# Patient Record
Sex: Female | Born: 2005
Health system: Southern US, Community
[De-identification: ages and names within clinical notes are randomized; demographics above are authoritative.]

## PROBLEM LIST (undated history)

## (undated) DIAGNOSIS — K921 Melena: Secondary | ICD-10-CM

## (undated) DIAGNOSIS — J189 Pneumonia, unspecified organism: Secondary | ICD-10-CM

## (undated) DIAGNOSIS — T7840XA Allergy, unspecified, initial encounter: Secondary | ICD-10-CM

## (undated) DIAGNOSIS — K200 Eosinophilic esophagitis: Secondary | ICD-10-CM

## (undated) DIAGNOSIS — J45909 Unspecified asthma, uncomplicated: Secondary | ICD-10-CM

## (undated) HISTORY — DX: Melena: K92.1

## (undated) HISTORY — DX: Eosinophilic esophagitis: K20.0

## (undated) HISTORY — PX: FRACTURE SURGERY: SHX138

---
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
## 2023-01-03 ENCOUNTER — Telehealth: Payer: Self-pay

## 2023-01-03 NOTE — Telephone Encounter (Signed)
All her blood work looks great. Please let her know.

## 2023-01-03 NOTE — Telephone Encounter (Signed)
Pt's labs are in her chart

## 2023-01-04 NOTE — Telephone Encounter (Signed)
Phoned and advised the pt of her result note. Pt expressed understanding

## 2023-03-03 ENCOUNTER — Encounter: Payer: Self-pay | Admitting: Internal Medicine

## 2023-04-01 DIAGNOSIS — J060 Acute laryngopharyngitis: Secondary | ICD-10-CM | POA: Diagnosis not present

## 2023-04-01 DIAGNOSIS — J039 Acute tonsillitis, unspecified: Secondary | ICD-10-CM | POA: Diagnosis not present

## 2023-04-01 DIAGNOSIS — U071 COVID-19: Secondary | ICD-10-CM | POA: Diagnosis not present

## 2023-04-01 DIAGNOSIS — R059 Cough, unspecified: Secondary | ICD-10-CM | POA: Diagnosis not present

## 2023-04-07 ENCOUNTER — Encounter: Payer: Self-pay | Admitting: Internal Medicine

## 2023-04-07 ENCOUNTER — Ambulatory Visit (INDEPENDENT_AMBULATORY_CARE_PROVIDER_SITE_OTHER): Payer: BC Managed Care – PPO | Admitting: Internal Medicine

## 2023-04-07 VITALS — BP 96/62 | HR 78 | Temp 98.4°F | Ht 66.0 in | Wt 155.0 lb

## 2023-04-07 DIAGNOSIS — K529 Noninfective gastroenteritis and colitis, unspecified: Secondary | ICD-10-CM

## 2023-04-07 DIAGNOSIS — K200 Eosinophilic esophagitis: Secondary | ICD-10-CM

## 2023-04-07 DIAGNOSIS — R7989 Other specified abnormal findings of blood chemistry: Secondary | ICD-10-CM

## 2023-04-07 DIAGNOSIS — K5282 Eosinophilic colitis: Secondary | ICD-10-CM

## 2023-04-07 NOTE — Progress Notes (Signed)
Primary Care Physician:  Donita Brooks, MD Primary Gastroenterologist:  Dr. Marletta Lor  Chief Complaint  Patient presents with   Follow-up    Follow up on eosinophillic esophagus    HPI:   Catherine Pittman is a 17 y.o. female who presents to the clinic today for follow up visit.  She has a history of eosinophilic esophagitis, eosinophilic colitis plus or minus IBD (+ pANCA and persistently elevated fecal calprotectin).  Significant workup previously, see below.  Liver biopsy 03/25/2022.  Liver biopsy normal without significant diagnostic alteration.  EGD 03/25/2022 largely unremarkable, mildly abnormal mucosa of the esophagus.  All biopsies WNL.  Flexible sigmoidoscopy 03/25/2022 with mild inflammation in the mid descending colon and distal rectum.  Biopsies in the rectum showed mild focal active colitis, all other colon biopsies WNL.  For her EOE, currently maintained on lansoprazole 30 mg daily.  Avoiding wheat.  Currently on Lialda 2.4 g daily for her IBD.  Today, accompanied by her mother.  Patient states overall she is doing well.  No issues with swallowing.  Feels like her EOE is well-controlled.  Currently taking lansoprazole 30 mg daily and budesonide swallowed.  In regards to her ?IBD, fecal calprotectin 10/18/2022 elevated at 167.  Restarted her mesalamine on previous visit which she states she is tolerating.  Having normal stools.  Occasionally constipated.  No blood or mucus in her stools.  No abdominal pain.    Blood work 12/30/2022 CBC, CMP, ESR, CRP, vitamin D, B12 all WNL.  Previous Workup:  GI symptoms started at 17yo with abdominal pain and hematchezia. Her labs showed an elevated calprotectin. 2014 EGD/colonoscopy diagnosed colitis/IBD at [facility] and then was referred to [facility]. She was placed on a few months of sulfasalazine. At [facility] a repeat EGD/colonoscopy was done and parents were told that she did not have IBD but did have EoE. She was placed on Flovent 2 puffs  BID and further biopsies were negative for EoE. Of note the most recent colonoscopy from 09/2019 showed chronic active colitis in the cecum which is c/w IBD. Parent state that they were unaware of this. Her previous GI doctor had placed her on high dose steroids with a wean. She was close to weaning off this summer, but then they reportedly were told by PCP to continue it at a lower dose. She had been on 10mg  daily for 4 months prior to her previous appointment. She is not on a prophylactic PPI.   01/19/22 C diff negative, calprotectin 602 (Cdiff/GIP/O&P neg) 05/2021 Stool O&P negative ([facility]) 01/26/21 MRCP normal 01/05/21 ALT 45, GGT 89 (rest of autoimmune hepatitis work up normal), hep B not immune - A1At MM, ceruloplasmin 27.8, ASMA (-), anti-LKM (-), ANA (-), INR 1.0 10/07/20 ALT 60, GGT 98 and (+)pANCA, ESR 23 09/05/20 labs were done at [facility] per mother (not scanned into our system). 07/04/20 calprotectin 360, esr 34, ferritin 4, ggt 97, vit D 20 03/18/20 ttg iga <1 with IGA 109, urea breath test neg 03/17/20 WBC 16, plt 628, hg normal 11.4, HFP normal, ESR 25 03/03/20 capsule endoscopy - Normal small bowel. 01/25/20 MRI Impression: No active small bowel or colonic inflammation. No evidence of stricturing or fistulization.  01/09/20 c diff neg, plt 652, crp 9, ESR 58  Calprotectin: 01/19/22 602, 01/05/21 421 (on Lialda), 10/07/20 268, 07/04/20 360, 12/27/19 399, 08/13/19 352, 03/15/19 911 (after coming off sulfasalazine - Dr. Rebeca Alert had recommended to repeat EGD and Colonoscopy, but family did not want to proceed  at that time), 11/12/15 135  Component  05/21/21 EGD/colonoscopy: on Lialda and PPI (?BID) showed upper and lower gastrointestinal eosinophilia    A. DUODENUM, BIOPSY: - Superficial fragments of duodenum with preserved villous architecture.   B. STOMACH, BIOPSY: - Gastric mucosa with no histologic abnormalities. - No eosinophils or H. pylori organisms identified on routine H&E sections.   C.  ESOPHAGUS, DISTAL, BIOPSY: - Benign squamous mucosa with increased intraepithelial eosinophils (focally up to 80/HPF).   D. ESOPHAGUS, MID, BIOPSY: - Benign squamous mucosa with no evidence of intraepithelial eosinophils.   E. ESOPHAGUS, PROXIMAL, BIOPSY: - Benign squamous mucosa with rare intraepithelial eosinophils (focally up to 1/HPF).   F. SMALL INTESTINE, TERMINAL ILEUM, BIOPSY: - Small bowel mucosa with no diagnostic abnormalities. - No evidence of increased eosinophils, intraepithelial lymphocytes, villous atrophy, or crypt hyperplasia.    G. COLON, ASCENDING, BIOPSY: - Colonic mucosa with increased intramucosal eosinophils (focally >100 eosinophils per HPF). - No evidence of chronic colitis.    H. COLON, TRANSVERSE & ASCENDING, BIOPSY: - Colonic mucosa with increased intraepithelial eosinophils (focally up to 50/HPF). - No evidence of chronic colitis.    I. COLON, RECTOSIGMOID, BIOPSY: - Colonic mucosa with increased intraepithelial eosinophils (focally up to 30/HPF). - No evidence of chronic colitis.  - See comment.    Electronically signed by Champ Mungo, MD on 05/26/2021 at 1435  Comment    The patient's history of asthma, eosinophilic esophagitis, and possible inflammatory bowel disease, currently on lansoprazole and Mesalazine is noted.    Histologic sections demonstrate increase in intraepithelial eosinophils (esophagus), and increased eosinophils within the lamina propria (colon). There are no histologic features supporting the diagnosis of inflammatory bowel disease on these specimens; including active inflammation (such as cryptitis, crypt abscess, or ulcers), or features of chronic injury (including architectural distortion of the mucosa, basal plasmacytosis, or metaplasia).    Overall, the above findings, in conjunction with the clinical history of asthma, raises the possibility of an eosinophilic enteritis or medication effect. However, given that  the increase of intraepithelial eosinophils in the esophagus is limited to the distal aspect, the possibility of an underlying/superimposed gastroesophageal reflux disease cannot be excluded. Correlation with clinical and endoscopic findings remains essential.   09/19/19 EGD/colon at [facility]: on flovent  A. Duodenum, second part and bulb, endoscopic biopsy: DUODENAL MUCOSA WITH NO PATHOLOGIC DIAGNOSIS.NO VILLOUS BLUNTING OR INCREASED INTRAEPITHELIAL LYMPHOCYTES ARE IDENTIFIED. B. Stomach, endoscopic biopsy: GASTRIC ANTRAL AND OXYNTIC MUCOSA WITH NO PATHOLOGIC DIAGNOSIS. NO ACTIVE OR CHRONIC GASTRITIS IS SEEN. NEGATIVE FOR EVIDENCE OF HELICOBACTER PYLORI ON ROUTINE H&E. C. Esophagus, lower third, endoscopic biopsy:SQUAMOUS MUCOSA WITH NO PATHOLOGIC DIAGNOSIS.NO EVIDENCE OF REFLUX OR EOSINOPHILIC ESOPHAGITIS IS SEEN.  D. Esophagus, middle third, endoscopic biopsy: SQUAMOUS MUCOSA WITH NO PATHOLOGIC DIAGNOSIS. NO EVIDENCE OF REFLUX OR EOSINOPHILIC ESOPHAGITIS IS SEEN. E. Esophagus, upper third, endoscopic biopsy: SQUAMOUS MUCOSA WITH NO PATHOLOGIC DIAGNOSIS. NO EVIDENCE OF REFLUX OR EOSINOPHILIC ESOPHAGITIS IS SEEN. F. Terminal ileum, endoscopic biopsy: ILEAL MUCOSA WITH NO PATHOLOGIC DIAGNOSIS. NO ACUTE OR CHRONIC ILEITIS IS IDENTIFIED. G. Cecum, endoscopic biopsy: COLONIC MUCOSA WITH PATCHY MILD CHRONIC ACTIVE COLITIS. NEGATIVE FOR DYSPLASIA. H. Right/ascending, endoscopic biopsy: COLONIC MUCOSA WITH NO PATHOLOGIC DIAGNOSIS. NEGATIVE FOR CHRONIC AND ACTIVE COLITIS. NEGATIVE FOR DYSPLASIA. I. Left/descending, endoscopic biopsy: COLONIC MUCOSA WITH NO PATHOLOGIC DIAGNOSIS. NEGATIVE FOR CHRONIC AND ACTIVE COLITIS. NEGATIVE FOR DYSPLASIA. J. Sigmoid colon, endoscopic biopsy: COLONIC MUCOSA WITH NO PATHOLOGIC DIAGNOSIS. NEGATIVE FOR CHRONIC AND ACTIVE COLITIS. NEGATIVE FOR DYSPLASIA. K. Rectum, endoscopic biopsy: COLORECTAL MUCOSA  WITH NO PATHOLOGIC DIAGNOSIS. NEGATIVE FOR CHRONIC AND ACTIVE COLITIS. NEGATIVE  FOR DYSPLASIA.   04/29/18 EGD/colon at [facility]: A. Duodenum, endoscopic biopsy: Duodenal mucosa with no significant pathologic diagnosis. No villous blunting or intraepithelial lymphocytosis is seen. B. Stomach, endoscopic biopsy: Gastric antral mucosa with no significant pathologic diagnosis. Negative for evidence of Helicobacter pylori on routine H&E.  C. Esophagus, lower third, endoscopic biopsy: Squamous mucosa with marked intraepithelial eosinophil infiltrates (up to 75 per high power field). See comment. D. Esophagus, upper third, endoscopic biopsy: Squamous mucosa with marked intraepithelial eosinophil infiltrates (up to 75 per high power field). See comment. Comment: In both distal and proximal esophagus (C, D), the eosinophils involve both superficial and deeper portions of the squamous epithelium. Intercellular edema, eosinophilic microabscesses and eosinophilic degranulation are present. The morphological findings are highly suggestive of eosinophilic esophagitis. E. Terminal ileum, endoscopic biopsy: Ileal mucosa with no significant pathologic diagnosis. No active or chronic enteritis is seen.  F. Right ascending colon, endoscopic biopsy: Colonic mucosa with no significant pathologic diagnosis. No active or chronic colitis is seen. No granulomas are seen. Negative for dysplasia. G. Left descending colon, endoscopic biopsy: Colonic mucosa with no significant pathologic diagnosis. No active or chronic colitis is seen. No granulomas are seen. Negative for dysplasia. H. Sigmoid colon and rectum, endoscopic biopsy: Colonic mucosa with no significant pathologic diagnosis. No active or chronic colitis is seen. No granulomas are seen. Negative for dysplasia.  04/20/13 colonoscopy at [facility] by Dr. Chestine Spore Colonoscopy completed 70-80 cm to cecum. Diffuse edema with adherent mucus and sporadic erosions but no ulcerations,granularlity or significant friability. Right sided involvement greater than left. Stool  collected for bacterial culture and Cdiff PCR. Biopsies submitted in formailn from cecum, ascending, descending and rectosigmoid colon. Awaiting results before initiating any therapy.  The terminal ileum was not evaluated at the time.     Past Medical History:  Diagnosis Date   Allergy    Asthma    Blood in stool    seen at [facility]- ulcerative colitis unlikely due to normal colonoscopy (2/20)   Eosinophilic esophagitis    Pneumonia     Past Surgical History:  Procedure Laterality Date   COLONOSCOPY N/A 04/20/2013   Procedure: COLONOSCOPY;  Surgeon: Jon Gills, MD;  Location: [facility] OR;  Service: Gastroenterology;  Laterality: N/A;   FRACTURE SURGERY      Current Outpatient Medications  Medication Sig Dispense Refill   albuterol (VENTOLIN HFA) 108 (90 Base) MCG/ACT inhaler Inhale 2 puffs into the lungs every 4 (four) hours as needed for wheezing or shortness of breath. 67 g 1   cholecalciferol (VITAMIN D) 25 MCG (1000 UNIT) tablet Take 1,000 Units by mouth daily. (Patient not taking: Reported on 12/30/2022)     Cholecalciferol (VITAMIN D3) 1.25 MG (50000 UT) CAPS      EPINEPHrine 0.3 mg/0.3 mL IJ SOAJ injection Inject 0.3 mg into the muscle as needed for anaphylaxis. 2 each 1   escitalopram (LEXAPRO) 10 MG tablet Take 1 tablet (10 mg total) by mouth daily. APPT NEEDED FOR 12/2022. 90 tablet 0   ferrous sulfate 325 (65 FE) MG tablet Take 325 mg by mouth every morning.     FLOVENT HFA 220 MCG/ACT inhaler Inhale 2 puffs into the lungs in the morning and at bedtime. 3 each 3   Iron-Vitamin C 100-250 MG TABS  (Patient not taking: Reported on 12/21/2021)     lansoprazole (PREVACID) 30 MG capsule Take 30 mg by mouth daily at 12 noon.  mesalamine (LIALDA) 1.2 g EC tablet Take by mouth daily with breakfast. (Patient not taking: Reported on 12/30/2022)     mesalamine (PENTASA) 250 MG CR capsule See admin instructions. (Patient not taking: Reported on 12/21/2021)     minocycline (MINOCIN) 50 MG  capsule Take 1 capsule (50 mg total) by mouth 2 (two) times daily. (Patient not taking: Reported on 12/21/2021) 60 capsule 8   sulfamethoxazole-trimethoprim (BACTRIM DS) 800-160 MG tablet TAKE 1 TABLET BY MOUTH TWICE A DAY WITH FOOD (Patient not taking: Reported on 12/21/2021) 60 tablet 3   Tazarotene (ARAZLO) 0.045 % LOTN Apply to affected area qhs (Patient not taking: Reported on 12/21/2021) 45 g 8   topiramate (TOPAMAX) 25 MG tablet Take 2 tablets (50 mg total) by mouth 2 (two) times daily. (Patient not taking: Reported on 12/30/2022) 120 tablet 5   tretinoin (RETIN-A) 0.025 % cream APPLY A PEA SIZED DOT TO FACE AT NIGHT (Patient not taking: Reported on 12/30/2022) 45 g 3   No current facility-administered medications for this visit.    Allergies as of 04/07/2023 - Review Complete 12/30/2022  Allergen Reaction Noted   Peanut-containing drug products  04/14/2020   Peanut oil Nausea And Vomiting and Nausea Only 04/04/2013    Family History  Problem Relation Age of Onset   Diabetes Maternal Aunt    Cancer Maternal Grandmother    Colon polyps Neg Hx    Inflammatory bowel disease Neg Hx     Social History   Socioeconomic History   Marital status: Single    Spouse name: Not on file   Number of children: Not on file   Years of education: Not on file   Highest education level: Not on file  Occupational History   Not on file  Tobacco Use   Smoking status: Never   Smokeless tobacco: Never  Vaping Use   Vaping status: Never Used  Substance and Sexual Activity   Alcohol use: Never   Drug use: Never   Sexual activity: Not on file  Other Topics Concern   Not on file  Social History Narrative   3rd grade 2014-2015   Social Determinants of Health   Financial Resource Strain: Not on file  Food Insecurity: Not on file  Transportation Needs: Not on file  Physical Activity: Not on file  Stress: Not on file  Social Connections: Not on file  Intimate Partner Violence: Not on file     Subjective: Review of Systems  Constitutional:  Negative for chills and fever.  HENT:  Negative for congestion and hearing loss.   Eyes:  Negative for blurred vision and double vision.  Respiratory:  Negative for cough and shortness of breath.   Cardiovascular:  Negative for chest pain and palpitations.  Gastrointestinal:  Negative for abdominal pain, blood in stool, constipation, diarrhea, heartburn, melena and vomiting.  Genitourinary:  Negative for dysuria and urgency.  Musculoskeletal:  Negative for joint pain and myalgias.  Skin:  Negative for itching and rash.  Neurological:  Negative for dizziness and headaches.  Psychiatric/Behavioral:  Negative for depression. The patient is not nervous/anxious.        Objective: BP 96/62   Pulse 78   Temp 98.4 F (36.9 C)   Ht 5\' 6"  (1.676 m)   Wt 155 lb (70.3 kg)   LMP 03/26/2023   BMI 25.02 kg/m  Physical Exam Constitutional:      Appearance: Normal appearance.  HENT:     Head: Normocephalic and atraumatic.  Eyes:  Extraocular Movements: Extraocular movements intact.     Conjunctiva/sclera: Conjunctivae normal.  Cardiovascular:     Rate and Rhythm: Normal rate and regular rhythm.  Pulmonary:     Effort: Pulmonary effort is normal.     Breath sounds: Normal breath sounds.  Abdominal:     General: Bowel sounds are normal.     Palpations: Abdomen is soft.  Musculoskeletal:        General: No swelling. Normal range of motion.     Cervical back: Normal range of motion and neck supple.  Skin:    General: Skin is warm and dry.     Coloration: Skin is not jaundiced.  Neurological:     General: No focal deficit present.     Mental Status: She is alert and oriented to person, place, and time.  Psychiatric:        Mood and Affect: Mood normal.        Behavior: Behavior normal.      AssessmentPlan:  1.  Eosinophilic esophagitis-symptoms well-controlled on lansoprazole daily as well as swallowed budesonide.  Will  continue.  Counseled on elimination diet.  2.  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  Continue on mesalamine.  Will recheck fecal calprotectin today.  Previously noted cecal inflammation makes UC less likely, ?Crohns colitis.  Patient does note taking a lot of NSAIDs during that time for chronic headaches so possible her right-sided inflammation was unrelated to her underlying inflammatory bowel disease altogether.  Follow-up in 6 months.  04/07/2023 3:05 PM   Disclaimer: This note was dictated with voice recognition software. Similar sounding words can inadvertently be transcribed and may not be corrected upon review.

## 2023-04-07 NOTE — Patient Instructions (Signed)
I am happy to hear that you are doing well.  Continue on lansoprazole and swallowed budesonide for your eosinophilic esophagitis.  Continue to avoid wheat.  Let me know if you need refills on your budesonide and I can send this in.  Continue on Lialda for your underlying IBD.  I am going to order fecal calprotectin test at [facility].  We will call with results.  Your most recent blood work all looks fantastic.  We will recheck in 6 months.  Follow-up in 6 months or sooner if needed.  It was very nice seeing both you again today.  Dr. Marletta Lor

## 2023-05-17 ENCOUNTER — Other Ambulatory Visit: Payer: Self-pay

## 2023-05-17 DIAGNOSIS — F411 Generalized anxiety disorder: Secondary | ICD-10-CM

## 2023-05-17 MED ORDER — ESCITALOPRAM OXALATE 10 MG PO TABS
10.0000 mg | ORAL_TABLET | Freq: Every day | ORAL | 0 refills | Status: DC
Start: 2023-05-17 — End: 2023-12-15

## 2023-05-31 ENCOUNTER — Ambulatory Visit: Payer: BC Managed Care – PPO | Admitting: Family Medicine

## 2023-05-31 ENCOUNTER — Encounter: Payer: Self-pay | Admitting: Family Medicine

## 2023-05-31 VITALS — BP 120/76 | HR 102 | Temp 98.4°F | Ht 66.1 in | Wt 155.4 lb

## 2023-05-31 DIAGNOSIS — R591 Generalized enlarged lymph nodes: Secondary | ICD-10-CM

## 2023-05-31 NOTE — Progress Notes (Signed)
Subjective:    Patient ID: Catherine Pittman, female    DOB: [birth year], 17 y.o.   MRN: [id number]  Patient has noticed a mass at the angle of her mandible on the right side.  It is roughly 1 cm in diameter.  It appears to be a small lymph node.  Patient states she noticed it after she went to the dentist.  She also has pain where her wisdom teeth are starting to come in.  He denies sore throat.  She denies any sinus pain.  She denies cough.  She denies any fever or chills.  She denies any bone pain.  She denies any fatigue or night sweats.  There are no other swollen lymph nodes anywhere else on the body.  Past Medical History:  Diagnosis Date   Allergy    Asthma    Blood in stool    seen at [facility]- ulcerative colitis unlikely due to normal colonoscopy (2/20)   Eosinophilic esophagitis    Pneumonia    Past Surgical History:  Procedure Laterality Date   COLONOSCOPY N/A 04/20/2013   Procedure: COLONOSCOPY;  Surgeon: Jon Gills, MD;  Location: [facility] OR;  Service: Gastroenterology;  Laterality: N/A;   FRACTURE SURGERY     Current Outpatient Medications on File Prior to Visit  Medication Sig Dispense Refill   albuterol (VENTOLIN HFA) 108 (90 Base) MCG/ACT inhaler Inhale 2 puffs into the lungs every 4 (four) hours as needed for wheezing or shortness of breath. 67 g 1   cholecalciferol (VITAMIN D) 25 MCG (1000 UNIT) tablet Take 1,000 Units by mouth daily. (Patient not taking: Reported on 12/30/2022)     Cholecalciferol (VITAMIN D3) 1.25 MG (50000 UT) CAPS      EPINEPHrine 0.3 mg/0.3 mL IJ SOAJ injection Inject 0.3 mg into the muscle as needed for anaphylaxis. 2 each 1   escitalopram (LEXAPRO) 10 MG tablet Take 1 tablet (10 mg total) by mouth daily. 30 tablet 0   ferrous sulfate 325 (65 FE) MG tablet Take 325 mg by mouth every morning.     lansoprazole (PREVACID) 30 MG capsule Take 30 mg by mouth daily at 12 noon.     mesalamine (LIALDA) 1.2 g EC tablet Take by mouth daily with breakfast.      No current facility-administered medications on file prior to visit.   Allergies  Allergen Reactions   Peanut-Containing Drug Products    Peanut Oil Nausea And Vomiting and Nausea Only   Social History   Socioeconomic History   Marital status: Single    Spouse name: Not on file   Number of children: Not on file   Years of education: Not on file   Highest education level: Not on file  Occupational History   Not on file  Tobacco Use   Smoking status: Never   Smokeless tobacco: Never  Vaping Use   Vaping status: Never Used  Substance and Sexual Activity   Alcohol use: Never   Drug use: Never   Sexual activity: Not on file  Other Topics Concern   Not on file  Social History Narrative   3rd grade 2014-2015   Social Drivers of Health   Financial Resource Strain: Not on file  Food Insecurity: Not on file  Transportation Needs: Not on file  Physical Activity: Not on file  Stress: Not on file  Social Connections: Not on file  Intimate Partner Violence: Not on file   Family History  Problem Relation Age of Onset   Diabetes  Maternal Aunt    Cancer Maternal Grandmother    Colon polyps Neg Hx    Inflammatory bowel disease Neg Hx       Review of Systems  Cardiovascular:  Positive for chest pain.  All other systems reviewed and are negative.      Objective:   Physical Exam Constitutional:      Appearance: She is well-developed. She is not ill-appearing or toxic-appearing.  HENT:     Head: Normocephalic and atraumatic.     Right Ear: Tympanic membrane and ear canal normal.     Left Ear: Tympanic membrane and ear canal normal.     Nose: Nose normal. No congestion or rhinorrhea.  Eyes:     Extraocular Movements: Extraocular movements intact.     Conjunctiva/sclera: Conjunctivae normal.     Pupils: Pupils are equal, round, and reactive to light.  Neck:     Thyroid: No thyromegaly.     Vascular: No JVD.   Cardiovascular:     Rate and Rhythm: Normal rate and  regular rhythm.     Heart sounds: Heart sounds not distant. No murmur heard.    No systolic murmur is present.     No diastolic murmur is present.     No gallop. No S3 or S4 sounds.  Pulmonary:     Effort: Pulmonary effort is normal. No tachypnea, accessory muscle usage or respiratory distress.     Breath sounds: Normal breath sounds. No stridor. No decreased breath sounds, wheezing, rhonchi or rales.  Abdominal:     General: There is no distension.     Palpations: Abdomen is soft. There is no splenomegaly.     Tenderness: There is no guarding or rebound.  Lymphadenopathy:     Cervical: Cervical adenopathy present.     Right cervical: Superficial cervical adenopathy present. No deep or posterior cervical adenopathy.    Left cervical: No superficial, deep or posterior cervical adenopathy.  Neurological:     General: No focal deficit present.     Mental Status: She is oriented to person, place, and time. Mental status is at baseline.     Cranial Nerves: No cranial nerve deficit.     Sensory: No sensory deficit.     Motor: No weakness.     Coordination: Coordination normal.     Gait: Gait normal.  Psychiatric:        Mood and Affect: Mood normal.        Behavior: Behavior normal.        Thought Content: Thought content normal.        Judgment: Judgment normal.     I was unable to palpate any enlarged inguinal lymph nodes or axillary lymph nodes today on exam.      Assessment & Plan:  Lymphadenopathy I believe this is a reactive lymph node likely the inflammation coming from the wisdom teeth.  There is no evidence of any serious bacterial infection or lymphoma.  We will monitor this.  If it grows in size, we can biopsy it.  If she develops any other neuropathy anywhere else on the body she will notify me immediately.  Patient and her mother are okay monitoring this for the present time

## 2023-09-14 ENCOUNTER — Encounter: Payer: Self-pay | Admitting: Internal Medicine

## 2023-11-02 DIAGNOSIS — K529 Noninfective gastroenteritis and colitis, unspecified: Secondary | ICD-10-CM | POA: Diagnosis not present

## 2023-11-05 LAB — CALPROTECTIN, FECAL: Calprotectin, Fecal: 907 ug/g — ABNORMAL HIGH (ref 0–120)

## 2023-11-07 ENCOUNTER — Ambulatory Visit: Payer: Self-pay | Admitting: Internal Medicine

## 2023-11-10 ENCOUNTER — Encounter: Payer: Self-pay | Admitting: Gastroenterology

## 2023-11-10 ENCOUNTER — Ambulatory Visit: Admitting: Gastroenterology

## 2023-11-10 VITALS — BP 120/80 | HR 120 | Temp 98.9°F | Ht 66.5 in | Wt 160.4 lb

## 2023-11-10 DIAGNOSIS — K921 Melena: Secondary | ICD-10-CM

## 2023-11-10 DIAGNOSIS — K200 Eosinophilic esophagitis: Secondary | ICD-10-CM

## 2023-11-10 DIAGNOSIS — K529 Noninfective gastroenteritis and colitis, unspecified: Secondary | ICD-10-CM | POA: Diagnosis not present

## 2023-11-10 MED ORDER — PREDNISONE 10 MG PO TABS: ORAL_TABLET | ORAL | 0 refills | Status: AC

## 2023-11-10 NOTE — Progress Notes (Addendum)
 Gastroenterology Office Note     Primary Care Physician:  Austine Lefort, MD  Primary Gastroenterologist: Dr. Mordechai April    Chief Complaint   Chief Complaint  Patient presents with   Follow-up    Having issues with abd pain after eating and diarrhea for the past week.      History of Present Illness   Catherine Pittman is a 18 y.o. female presenting today with a history of eosinophilic esophagitis, eosinophilic colitis plus or minus IBD (+ pANCA and persistently elevated fecal calprotectin), mildly elevated transaminases in the past and liver biopsy normal.  Significant workup previously as below.  Last seen Oct 2024. Most recent diagnostic procedure was flex sig in 03/25/2022 with mild inflammation in the mid descending colon and distal rectum.  Biopsies in the rectum showed mild focal active colitis, all other colon biopsies WNL. She has been on Lialda historically. Presents in follow-up today with recent markedly elevated fecal calprotectin at 907. Her mother is present with her today.   EOE: controlled on PPI daily and oral budesonide suspension  ?IBD/eosinophilic colitis: on mesalamine. Fecal cal in May 2024 was 167. Recent one May 2025 markedly elevated at 907. Last dose of lialda was many months ago. Doesn't like the taste. 2 capsules a day would make her constipated. Postprandial abdominal discomfort after eating. Notes tenesmus. Having 1 or 2 watery stools a day. Saw some blood in stool sample. Eating wheat products. Lower abdominal cramping. No fever or chills.   Has budesonide at home but not taking daily. Taking prevacid daily. Sometimes feels like bread gets stuck.     Seeing [facility] in near future. She is declining topical rectal therapies. Discussed updating colonoscopy, and both mom and patient want to avoid this as is expensive. They are curious is any other therapies could be tried instead.    EGD 03/25/2022 largely unremarkable, mildly abnormal mucosa of  the esophagus.  All biopsies WNL.   Flexible sigmoidoscopy 03/25/2022 with mild inflammation in the mid descending colon and distal rectum.  Biopsies in the rectum showed mild focal active colitis, all other colon biopsies WNL.  Previous Workup:   GI symptoms started at 18yo with abdominal pain and hematchezia. Her labs showed an elevated calprotectin. 2014 EGD/colonoscopy diagnosed colitis/IBD at [facility] and then was referred to [facility]. She was placed on a few months of sulfasalazine . At [facility] a repeat EGD/colonoscopy was done and parents were told that she did not have IBD but did have EoE. She was placed on Flovent  2 puffs BID and further biopsies were negative for EoE. Of note the most recent colonoscopy from 09/2019 showed chronic active colitis in the cecum which is c/w IBD. Parent state that they were unaware of this. Her previous GI doctor had placed her on high dose steroids with a wean. She was close to weaning off this summer, but then they reportedly were told by PCP to continue it at a lower dose. She had been on 10mg  daily for 4 months prior to her previous appointment. She is not on a prophylactic PPI.   01/19/22 C diff negative, calprotectin 602 (Cdiff/GIP/O&P neg) 05/2021 Stool O&P negative ([facility]) 01/26/21 MRCP normal 01/05/21 ALT 45, GGT 89 (rest of autoimmune hepatitis work up normal), hep B not immune - A1At MM, ceruloplasmin 27.8, ASMA (-), anti-LKM (-), ANA (-), INR 1.0 10/07/20 ALT 60, GGT 98 and (+)pANCA, ESR 23 09/05/20 labs were done at [facility] per mother (not scanned into our system). 07/04/20  calprotectin 360, esr 34, ferritin 4, ggt 97, vit D 20 03/18/20 ttg iga <1 with IGA 109, urea  breath test neg 03/17/20 WBC 16, plt 628, hg normal 11.4, HFP normal, ESR 25 03/03/20 capsule endoscopy - Normal small bowel. 01/25/20 MRI Impression: No active small bowel or colonic inflammation. No evidence of stricturing or fistulization.  01/09/20 c diff neg, plt 652, crp 9, ESR  58  Calprotectin: 01/19/22 602, 01/05/21 421 (on Lialda), 10/07/20 268, 07/04/20 360, 12/27/19 399, 08/13/19 352, 03/15/19 911 (after coming off sulfasalazine  - Dr. Charlet Conradi had recommended to repeat EGD and Colonoscopy, but family did not want to proceed at that time), 11/12/15 135  Component  05/21/21 EGD/colonoscopy: on Lialda and PPI (?BID) showed upper and lower gastrointestinal eosinophilia    A. DUODENUM, BIOPSY: - Superficial fragments of duodenum with preserved villous architecture.   B. STOMACH, BIOPSY: - Gastric mucosa with no histologic abnormalities. - No eosinophils or H. pylori organisms identified on routine H&E sections.   C. ESOPHAGUS, DISTAL, BIOPSY: - Benign squamous mucosa with increased intraepithelial eosinophils (focally up to 80/HPF).   D. ESOPHAGUS, MID, BIOPSY: - Benign squamous mucosa with no evidence of intraepithelial eosinophils.   E. ESOPHAGUS, PROXIMAL, BIOPSY: - Benign squamous mucosa with rare intraepithelial eosinophils (focally up to 1/HPF).   F. SMALL INTESTINE, TERMINAL ILEUM, BIOPSY: - Small bowel mucosa with no diagnostic abnormalities. - No evidence of increased eosinophils, intraepithelial lymphocytes, villous atrophy, or crypt hyperplasia.    G. COLON, ASCENDING, BIOPSY: - Colonic mucosa with increased intramucosal eosinophils (focally >100 eosinophils per HPF). - No evidence of chronic colitis.    H. COLON, TRANSVERSE & ASCENDING, BIOPSY: - Colonic mucosa with increased intraepithelial eosinophils (focally up to 50/HPF). - No evidence of chronic colitis.    I. COLON, RECTOSIGMOID, BIOPSY: - Colonic mucosa with increased intraepithelial eosinophils (focally up to 30/HPF). - No evidence of chronic colitis.  - See comment.    Electronically signed by Peyton Brash, MD on 05/26/2021 at 1435  Comment    The patient's history of asthma, eosinophilic esophagitis, and possible inflammatory bowel disease, currently on lansoprazole and  Mesalazine is noted.    Histologic sections demonstrate increase in intraepithelial eosinophils (esophagus), and increased eosinophils within the lamina propria (colon). There are no histologic features supporting the diagnosis of inflammatory bowel disease on these specimens; including active inflammation (such as cryptitis, crypt abscess, or ulcers), or features of chronic injury (including architectural distortion of the mucosa, basal plasmacytosis, or metaplasia).    Overall, the above findings, in conjunction with the clinical history of asthma, raises the possibility of an eosinophilic enteritis or medication effect. However, given that the increase of intraepithelial eosinophils in the esophagus is limited to the distal aspect, the possibility of an underlying/superimposed gastroesophageal reflux disease cannot be excluded. Correlation with clinical and endoscopic findings remains essential.   09/19/19 EGD/colon at [facility]: on flovent   A. Duodenum, second part and bulb, endoscopic biopsy: DUODENAL MUCOSA WITH NO PATHOLOGIC DIAGNOSIS.NO VILLOUS BLUNTING OR INCREASED INTRAEPITHELIAL LYMPHOCYTES ARE IDENTIFIED. B. Stomach, endoscopic biopsy: GASTRIC ANTRAL AND OXYNTIC MUCOSA WITH NO PATHOLOGIC DIAGNOSIS. NO ACTIVE OR CHRONIC GASTRITIS IS SEEN. NEGATIVE FOR EVIDENCE OF HELICOBACTER PYLORI ON ROUTINE H&E. C. Esophagus, lower third, endoscopic biopsy:SQUAMOUS MUCOSA WITH NO PATHOLOGIC DIAGNOSIS.NO EVIDENCE OF REFLUX OR EOSINOPHILIC ESOPHAGITIS IS SEEN.  D. Esophagus, middle third, endoscopic biopsy: SQUAMOUS MUCOSA WITH NO PATHOLOGIC DIAGNOSIS. NO EVIDENCE OF REFLUX OR EOSINOPHILIC ESOPHAGITIS IS SEEN. E. Esophagus, upper third, endoscopic biopsy: SQUAMOUS MUCOSA WITH NO PATHOLOGIC  DIAGNOSIS. NO EVIDENCE OF REFLUX OR EOSINOPHILIC ESOPHAGITIS IS SEEN. F. Terminal ileum, endoscopic biopsy: ILEAL MUCOSA WITH NO PATHOLOGIC DIAGNOSIS. NO ACUTE OR CHRONIC ILEITIS IS IDENTIFIED. G. Cecum, endoscopic biopsy:  COLONIC MUCOSA WITH PATCHY MILD CHRONIC ACTIVE COLITIS. NEGATIVE FOR DYSPLASIA. H. Right/ascending, endoscopic biopsy: COLONIC MUCOSA WITH NO PATHOLOGIC DIAGNOSIS. NEGATIVE FOR CHRONIC AND ACTIVE COLITIS. NEGATIVE FOR DYSPLASIA. I. Left/descending, endoscopic biopsy: COLONIC MUCOSA WITH NO PATHOLOGIC DIAGNOSIS. NEGATIVE FOR CHRONIC AND ACTIVE COLITIS. NEGATIVE FOR DYSPLASIA. J. Sigmoid colon, endoscopic biopsy: COLONIC MUCOSA WITH NO PATHOLOGIC DIAGNOSIS. NEGATIVE FOR CHRONIC AND ACTIVE COLITIS. NEGATIVE FOR DYSPLASIA. K. Rectum, endoscopic biopsy: COLORECTAL MUCOSA WITH NO PATHOLOGIC DIAGNOSIS. NEGATIVE FOR CHRONIC AND ACTIVE COLITIS. NEGATIVE FOR DYSPLASIA.   04/29/18 EGD/colon at [facility]: A. Duodenum, endoscopic biopsy: Duodenal mucosa with no significant pathologic diagnosis. No villous blunting or intraepithelial lymphocytosis is seen. B. Stomach, endoscopic biopsy: Gastric antral mucosa with no significant pathologic diagnosis. Negative for evidence of Helicobacter pylori on routine H&E.  C. Esophagus, lower third, endoscopic biopsy: Squamous mucosa with marked intraepithelial eosinophil infiltrates (up to 75 per high power field). See comment. D. Esophagus, upper third, endoscopic biopsy: Squamous mucosa with marked intraepithelial eosinophil infiltrates (up to 75 per high power field). See comment. Comment: In both distal and proximal esophagus (C, D), the eosinophils involve both superficial and deeper portions of the squamous epithelium. Intercellular edema, eosinophilic microabscesses and eosinophilic degranulation are present. The morphological findings are highly suggestive of eosinophilic esophagitis. E. Terminal ileum, endoscopic biopsy: Ileal mucosa with no significant pathologic diagnosis. No active or chronic enteritis is seen.  F. Right ascending colon, endoscopic biopsy: Colonic mucosa with no significant pathologic diagnosis. No active or chronic colitis is seen. No granulomas are  seen. Negative for dysplasia. G. Left descending colon, endoscopic biopsy: Colonic mucosa with no significant pathologic diagnosis. No active or chronic colitis is seen. No granulomas are seen. Negative for dysplasia. H. Sigmoid colon and rectum, endoscopic biopsy: Colonic mucosa with no significant pathologic diagnosis. No active or chronic colitis is seen. No granulomas are seen. Negative for dysplasia.  04/20/13 colonoscopy at [facility] by Dr. Fulton Job Colonoscopy completed 70-80 cm to cecum. Diffuse edema with adherent mucus and sporadic erosions but no ulcerations,granularlity or significant friability. Right sided involvement greater than left. Stool collected for bacterial culture and Cdiff PCR. Biopsies submitted in formailn from cecum, ascending, descending and rectosigmoid colon. Awaiting results before initiating any therapy.  The terminal ileum was not evaluated at the time.     Past Medical History:  Diagnosis Date   Allergy    Asthma    Blood in stool    seen at [facility]- ulcerative colitis unlikely due to normal colonoscopy (2/20)   Eosinophilic esophagitis    Pneumonia     Past Surgical History:  Procedure Laterality Date   COLONOSCOPY N/A 04/20/2013   Procedure: COLONOSCOPY;  Surgeon: Fortunato Ill, MD;  Location: [facility] OR;  Service: Gastroenterology;  Laterality: N/A;   FRACTURE SURGERY      Current Outpatient Medications  Medication Sig Dispense Refill   cholecalciferol (VITAMIN D ) 25 MCG (1000 UNIT) tablet Take 1,000 Units by mouth daily.     EPINEPHrine  0.3 mg/0.3 mL IJ SOAJ injection Inject 0.3 mg into the muscle as needed for anaphylaxis. 2 each 1   escitalopram  (LEXAPRO ) 10 MG tablet Take 1 tablet (10 mg total) by mouth daily. 30 tablet 0   ferrous sulfate 325 (65 FE) MG tablet Take 325 mg by mouth every morning.  lansoprazole (PREVACID) 30 MG capsule Take 30 mg by mouth daily at 12 noon.     mesalamine (LIALDA) 1.2 g EC tablet Take by mouth daily with breakfast.     No  current facility-administered medications for this visit.    Allergies as of 11/10/2023 - Review Complete 11/10/2023  Allergen Reaction Noted   Peanut-containing drug products  04/14/2020   Peanut oil Nausea And Vomiting and Nausea Only 04/04/2013    Family History  Problem Relation Age of Onset   Diabetes Maternal Aunt    Cancer Maternal Grandmother    Colon polyps Neg Hx    Inflammatory bowel disease Neg Hx     Social History   Socioeconomic History   Marital status: Single    Spouse name: Not on file   Number of children: Not on file   Years of education: Not on file   Highest education level: Not on file  Occupational History   Not on file  Tobacco Use   Smoking status: Never   Smokeless tobacco: Never  Vaping Use   Vaping status: Never Used  Substance and Sexual Activity   Alcohol use: Never   Drug use: Never   Sexual activity: Not on file  Other Topics Concern   Not on file  Social History Narrative   3rd grade 2014-2015   Social Drivers of Health   Financial Resource Strain: Not on file  Food Insecurity: Not on file  Transportation Needs: Not on file  Physical Activity: Not on file  Stress: Not on file  Social Connections: Not on file  Intimate Partner Violence: Not on file     Review of Systems   Gen: Denies any fever, chills, fatigue, weight loss, lack of appetite.  CV: Denies chest pain, heart palpitations, peripheral edema, syncope.  Resp: Denies shortness of breath at rest or with exertion. Denies wheezing or cough.  GI: Denies dysphagia or odynophagia. Denies jaundice, hematemesis, fecal incontinence. GU : Denies urinary burning, urinary frequency, urinary hesitancy MS: Denies joint pain, muscle weakness, cramps, or limitation of movement.  Derm: Denies rash, itching, dry skin Psych: Denies depression, anxiety, memory loss, and confusion Heme: Denies bruising, bleeding, and enlarged lymph nodes.   Physical Exam   BP 120/80 (BP Location:  Right Arm, Patient Position: Sitting, Cuff Size: Normal)   Pulse (!) 120   Temp 98.9 F (37.2 C) (Oral)   Ht 5' 6.5" (1.689 m)   Wt 160 lb 6.4 oz (72.8 kg)   LMP  (LMP Unknown)   BMI 25.50 kg/m  General:   Alert and oriented. Pleasant and cooperative. Well-nourished and well-developed.  Head:  Normocephalic and atraumatic. Eyes:  Without icterus Abdomen:  +BS, soft, non-tender and non-distended. No HSM noted. No guarding or rebound. No masses appreciated.  Rectal:  Deferred  Msk:  Symmetrical without gross deformities. Normal posture. Extremities:  Without edema. Neurologic:  Alert and  oriented x4;  grossly normal neurologically. Skin:  Intact without significant lesions or rashes. Psych:  Alert and cooperative. Normal mood and affect.   Assessment   Katira Dumais is a 18 y.o. female presenting today with a history of eosinophilic esophagitis, eosinophilic colitis plus or minus IBD (+ pANCA and persistently elevated fecal calprotectin), mildly elevated transaminases in the past and liver biopsy normal, prior evaluation as above with last flex sig in 2023, here for 6 month follow-up.  She has had recently markedly elevated fecal calprotectin and presenting with symptoms consistent with flare, which was precipitated by  coming off mesalamine all together several months ago. Notably, she did not tolerate mesalamine 2 capsules a day as caused constipation, and she stopped this on her own. We discussed updating colonoscopy for histology, but both mom and patient want to avoid this due to cost and invasive. They are desiring to trial a different treatment without colonoscopy if possible, and they are willing to obtain labs that are needed. Will check further labs and discuss next management with Dr. Mordechai April. As she has history of eosinophilic colitis with possible IBD overlay, could consider azathioprine or entyvio, as this has been used in patients with improvement. Ideally, we would have  histology and colonoscopy to evaluate disease progress, but will hold off on this at her request. Updating MRE or CTE would be helpful as well.   EOE: continue PPI daily. Add back budesonide suspension once completed round of steroids for flare.     PLAN    Continue PPI daily Steroid taper provided Extensive labs, include TB gold assay, Hep B serologies Keep upcoming appt with allergist, as this will be helpful with her history Discussing further management further after review of labs Return in 3 months regardless   Delman Ferns, PhD, ANP-BC [facility]enter[facility]

## 2023-11-10 NOTE — Patient Instructions (Addendum)
 Continue Prevacid daily. Let's hold off on the budesonide suspension until you are done with prednisone.   You will take 40 mg prednisone 1 week, then 30 mg for 1 week, then 20 mg for 1 week, then 10 mg for one week and done.  When you see the allergist, make sure to mention you have eosinophilic esophagitis, eosinophilic colitis with possible IBD (indeterminate colitis at this point and have treated with mesalamine in the past).   Please have labs done. We will message with results and next steps! Hold off on Lialda for now.  We will see you in 3 months regardless!  It was a pleasure to see you today. I want to create trusting relationships with patients and provide genuine, compassionate, and quality care. I truly value your feedback, so please be on the lookout for a survey regarding your visit with me today. I appreciate your time in completing this!         Delman Ferns, PhD, ANP-BC [facility]

## 2023-11-11 DIAGNOSIS — K529 Noninfective gastroenteritis and colitis, unspecified: Secondary | ICD-10-CM | POA: Diagnosis not present

## 2023-11-16 LAB — CBC WITH DIFFERENTIAL/PLATELET
Basophils Absolute: 0 10*3/uL (ref 0.0–0.2)
Basos: 0 %
EOS (ABSOLUTE): 0 10*3/uL (ref 0.0–0.4)
Eos: 0 %
Hematocrit: 41 % (ref 34.0–46.6)
Hemoglobin: 13.6 g/dL (ref 11.1–15.9)
Immature Grans (Abs): 0 10*3/uL (ref 0.0–0.1)
Immature Granulocytes: 0 %
Lymphocytes Absolute: 1.7 10*3/uL (ref 0.7–3.1)
Lymphs: 14 %
MCH: 29.4 pg (ref 26.6–33.0)
MCHC: 33.2 g/dL (ref 31.5–35.7)
MCV: 89 fL (ref 79–97)
Monocytes Absolute: 0.4 10*3/uL (ref 0.1–0.9)
Monocytes: 3 %
Neutrophils Absolute: 10.3 10*3/uL — ABNORMAL HIGH (ref 1.4–7.0)
Neutrophils: 83 %
Platelets: 488 10*3/uL — ABNORMAL HIGH (ref 150–450)
RBC: 4.62 x10E6/uL (ref 3.77–5.28)
RDW: 11.9 % (ref 11.7–15.4)
WBC: 12.5 10*3/uL — ABNORMAL HIGH (ref 3.4–10.8)

## 2023-11-16 LAB — COMPREHENSIVE METABOLIC PANEL WITH GFR
ALT: 27 IU/L (ref 0–32)
AST: 15 IU/L (ref 0–40)
Albumin: 4.8 g/dL (ref 4.0–5.0)
Alkaline Phosphatase: 124 IU/L — ABNORMAL HIGH (ref 42–106)
BUN/Creatinine Ratio: 8 — ABNORMAL LOW (ref 9–23)
BUN: 6 mg/dL (ref 6–20)
Bilirubin Total: 0.4 mg/dL (ref 0.0–1.2)
CO2: 18 mmol/L — ABNORMAL LOW (ref 20–29)
Calcium: 9.9 mg/dL (ref 8.7–10.2)
Chloride: 101 mmol/L (ref 96–106)
Creatinine, Ser: 0.79 mg/dL (ref 0.57–1.00)
Globulin, Total: 3 g/dL (ref 1.5–4.5)
Glucose: 162 mg/dL — ABNORMAL HIGH (ref 70–99)
Potassium: 3.8 mmol/L (ref 3.5–5.2)
Sodium: 138 mmol/L (ref 134–144)
Total Protein: 7.8 g/dL (ref 6.0–8.5)
eGFR: 110 mL/min/{1.73_m2} (ref >59)

## 2023-11-16 LAB — QUANTIFERON-TB GOLD PLUS
QuantiFERON Mitogen Value: 8.04 [IU]/mL
QuantiFERON Nil Value: 0 [IU]/mL
QuantiFERON TB1 Ag Value: 0.01 [IU]/mL
QuantiFERON TB2 Ag Value: 0.01 [IU]/mL

## 2023-11-16 LAB — LIPID PANEL
Chol/HDL Ratio: 2.3 ratio (ref 0.0–4.4)
Cholesterol, Total: 128 mg/dL (ref 100–169)
HDL: 55 mg/dL (ref >39)
LDL Chol Calc (NIH): 60 mg/dL (ref 0–109)
Triglycerides: 58 mg/dL (ref 0–89)
VLDL Cholesterol Cal: 13 mg/dL (ref 5–40)

## 2023-11-16 LAB — SEDIMENTATION RATE: Sed Rate: 10 mm/h (ref 0–32)

## 2023-11-16 LAB — C-REACTIVE PROTEIN: CRP: <1 mg/L (ref 0–10)

## 2023-11-16 LAB — HEPATITIS B SURFACE ANTIGEN: Hepatitis B Surface Ag: NEGATIVE

## 2023-11-16 LAB — VARICELLA ZOSTER ABS, IGG/IGM
Varicella IgM: 0.98 {index} — ABNORMAL HIGH (ref 0.00–0.90)
Varicella zoster IgG: REACTIVE

## 2023-11-23 ENCOUNTER — Ambulatory Visit: Payer: Self-pay | Admitting: Gastroenterology

## 2023-11-24 MED ORDER — BUDESONIDE 3 MG PO CPEP: ORAL_CAPSULE | ORAL | 0 refills | Status: DC

## 2023-11-25 DIAGNOSIS — Z91013 Allergy to seafood: Secondary | ICD-10-CM | POA: Diagnosis not present

## 2023-11-25 DIAGNOSIS — J301 Allergic rhinitis due to pollen: Secondary | ICD-10-CM | POA: Diagnosis not present

## 2023-11-25 DIAGNOSIS — J4530 Mild persistent asthma, uncomplicated: Secondary | ICD-10-CM | POA: Diagnosis not present

## 2023-11-25 DIAGNOSIS — Z91010 Allergy to peanuts: Secondary | ICD-10-CM | POA: Diagnosis not present

## 2023-11-29 DIAGNOSIS — Z91010 Allergy to peanuts: Secondary | ICD-10-CM | POA: Diagnosis not present

## 2023-11-29 DIAGNOSIS — T781XXA Other adverse food reactions, not elsewhere classified, initial encounter: Secondary | ICD-10-CM | POA: Diagnosis not present

## 2023-12-12 ENCOUNTER — Other Ambulatory Visit: Payer: Self-pay | Admitting: Gastroenterology

## 2023-12-12 DIAGNOSIS — K200 Eosinophilic esophagitis: Secondary | ICD-10-CM

## 2023-12-12 MED ORDER — LANSOPRAZOLE 30 MG PO CPDR: 30.0000 mg | DELAYED_RELEASE_CAPSULE | Freq: Every day | ORAL | 5 refills | Status: DC

## 2023-12-15 ENCOUNTER — Other Ambulatory Visit: Payer: Self-pay | Admitting: Family Medicine

## 2023-12-15 DIAGNOSIS — F411 Generalized anxiety disorder: Secondary | ICD-10-CM

## 2023-12-17 ENCOUNTER — Other Ambulatory Visit: Payer: Self-pay | Admitting: Gastroenterology

## 2023-12-21 NOTE — Telephone Encounter (Signed)
 Can we reach out to the pharmacy and see why they need 90 days? Patient already picked this up in June. We don't need a refill as long as she got the whole taper at once when they filled it, which should be the case.

## 2024-02-21 ENCOUNTER — Encounter: Payer: Self-pay | Admitting: *Deleted

## 2024-02-21 ENCOUNTER — Ambulatory Visit (INDEPENDENT_AMBULATORY_CARE_PROVIDER_SITE_OTHER): Admitting: Gastroenterology

## 2024-02-21 ENCOUNTER — Other Ambulatory Visit: Payer: Self-pay | Admitting: *Deleted

## 2024-02-21 VITALS — BP 106/62 | HR 88 | Temp 98.7°F | Ht 66.0 in | Wt 164.6 lb

## 2024-02-21 DIAGNOSIS — K529 Noninfective gastroenteritis and colitis, unspecified: Secondary | ICD-10-CM

## 2024-02-21 DIAGNOSIS — K200 Eosinophilic esophagitis: Secondary | ICD-10-CM

## 2024-02-21 DIAGNOSIS — R7989 Other specified abnormal findings of blood chemistry: Secondary | ICD-10-CM

## 2024-02-21 NOTE — Progress Notes (Signed)
 Gastroenterology Office Note     Primary Care Physician:  Duanne Butler DASEN, MD  Primary Gastroenterologist: Dr. Cindie   Chief Complaint   Chief Complaint  Patient presents with   Follow-up    Follow up on eosinophilic esophagitis, eosinophilic colitis plus or minus IBD (+ pANCA and persistently. Pt states she has been doing fine and nothing is bothering her today     History of Present Illness   Catherine Pittman is a 18 y.o. female presenting today with a history of eosinophilic esophagitis, eosinophilic colitis plus or minus IBD (+ pANCA and persistently elevated fecal calprotectin), mildly elevated transaminases in the past and liver biopsy normal.  Significant workup previously as below. She was last seen in May 2025 and started on oral budesonide  capsules to hopefully induce remission.   BM once to twice a day. Sometimes loose, watery, sometimes hard.   Missed a week on budesonide , finishes taper today. Abdominal pain improved but still intermittent. Worried about eating when she is not on it any longer. Ate gluten free FF but was in oil and then was ok. Sometimes difficult going to bathroom if she doesn't go when she needs to.   She would rather not complete a colonoscopy or do enemas. She is willing to do a CTE and check fecal calprotectin. Fecal cal was elevated at 907 in May 2025.    EGD 03/25/2022 largely unremarkable, mildly abnormal mucosa of the esophagus.  All biopsies WNL.   Flexible sigmoidoscopy 03/25/2022 with mild inflammation in the mid descending colon and distal rectum.  Biopsies in the rectum showed mild focal active colitis, all other colon biopsies WNL.   Previous Workup:   GI symptoms started at 18yo with abdominal pain and hematchezia. Her labs showed an elevated calprotectin. 2014 EGD/colonoscopy diagnosed colitis/IBD at [facility] and then was referred to [facility]. She was placed on a few months of sulfasalazine . At [facility] a repeat EGD/colonoscopy was done  and parents were told that she did not have IBD but did have EoE. She was placed on Flovent  2 puffs BID and further biopsies were negative for EoE. Of note the most recent colonoscopy from 09/2019 showed chronic active colitis in the cecum which is c/w IBD. Parent state that they were unaware of this. Her previous GI doctor had placed her on high dose steroids with a wean. She was close to weaning off this summer, but then they reportedly were told by PCP to continue it at a lower dose. She had been on 10mg  daily for 4 months prior to her previous appointment. She is not on a prophylactic PPI.   01/19/22 C diff negative, calprotectin 602 (Cdiff/GIP/O&P neg) 05/2021 Stool O&P negative ([facility]) 01/26/21 MRCP normal 01/05/21 ALT 45, GGT 89 (rest of autoimmune hepatitis work up normal), hep B not immune - A1At MM, ceruloplasmin 27.8, ASMA (-), anti-LKM (-), ANA (-), INR 1.0 10/07/20 ALT 60, GGT 98 and (+)pANCA, ESR 23 09/05/20 labs were done at [facility] per mother (not scanned into our system). 07/04/20 calprotectin 360, esr 34, ferritin 4, ggt 97, vit D 20 03/18/20 ttg iga <1 with IGA 109, urea  breath test neg 03/17/20 WBC 16, plt 628, hg normal 11.4, HFP normal, ESR 25 03/03/20 capsule endoscopy - Normal small bowel. 01/25/20 MRI Impression: No active small bowel or colonic inflammation. No evidence of stricturing or fistulization.  01/09/20 c diff neg, plt 652, crp 9, ESR 58  Calprotectin: 01/19/22 602, 01/05/21 421 (on Lialda), 10/07/20 268, 07/04/20 360,  12/27/19 399, 08/13/19 352, 03/15/19 911 (after coming off sulfasalazine  - Dr. Bambi had recommended to repeat EGD and Colonoscopy, but family did not want to proceed at that time), 11/12/15 135  Component  05/21/21 EGD/colonoscopy: on Lialda and PPI (?BID) showed upper and lower gastrointestinal eosinophilia    A. DUODENUM, BIOPSY: - Superficial fragments of duodenum with preserved villous architecture.   B. STOMACH, BIOPSY: - Gastric mucosa with no histologic  abnormalities. - No eosinophils or H. pylori organisms identified on routine H&E sections.   C. ESOPHAGUS, DISTAL, BIOPSY: - Benign squamous mucosa with increased intraepithelial eosinophils (focally up to 80/HPF).   D. ESOPHAGUS, MID, BIOPSY: - Benign squamous mucosa with no evidence of intraepithelial eosinophils.   E. ESOPHAGUS, PROXIMAL, BIOPSY: - Benign squamous mucosa with rare intraepithelial eosinophils (focally up to 1/HPF).   F. SMALL INTESTINE, TERMINAL ILEUM, BIOPSY: - Small bowel mucosa with no diagnostic abnormalities. - No evidence of increased eosinophils, intraepithelial lymphocytes, villous atrophy, or crypt hyperplasia.    G. COLON, ASCENDING, BIOPSY: - Colonic mucosa with increased intramucosal eosinophils (focally >100 eosinophils per HPF). - No evidence of chronic colitis.    H. COLON, TRANSVERSE & ASCENDING, BIOPSY: - Colonic mucosa with increased intraepithelial eosinophils (focally up to 50/HPF). - No evidence of chronic colitis.    I. COLON, RECTOSIGMOID, BIOPSY: - Colonic mucosa with increased intraepithelial eosinophils (focally up to 30/HPF). - No evidence of chronic colitis.  - See comment.    Electronically signed by Darice Lazaro Eloisa Patricio, MD on 05/26/2021 at 1435  Comment    The patient's history of asthma, eosinophilic esophagitis, and possible inflammatory bowel disease, currently on lansoprazole  and Mesalazine is noted.    Histologic sections demonstrate increase in intraepithelial eosinophils (esophagus), and increased eosinophils within the lamina propria (colon). There are no histologic features supporting the diagnosis of inflammatory bowel disease on these specimens; including active inflammation (such as cryptitis, crypt abscess, or ulcers), or features of chronic injury (including architectural distortion of the mucosa, basal plasmacytosis, or metaplasia).    Overall, the above findings, in conjunction with the clinical history of  asthma, raises the possibility of an eosinophilic enteritis or medication effect. However, given that the increase of intraepithelial eosinophils in the esophagus is limited to the distal aspect, the possibility of an underlying/superimposed gastroesophageal reflux disease cannot be excluded. Correlation with clinical and endoscopic findings remains essential.   09/19/19 EGD/colon at [facility]: on flovent   A. Duodenum, second part and bulb, endoscopic biopsy: DUODENAL MUCOSA WITH NO PATHOLOGIC DIAGNOSIS.NO VILLOUS BLUNTING OR INCREASED INTRAEPITHELIAL LYMPHOCYTES ARE IDENTIFIED. B. Stomach, endoscopic biopsy: GASTRIC ANTRAL AND OXYNTIC MUCOSA WITH NO PATHOLOGIC DIAGNOSIS. NO ACTIVE OR CHRONIC GASTRITIS IS SEEN. NEGATIVE FOR EVIDENCE OF HELICOBACTER PYLORI ON ROUTINE H&E. C. Esophagus, lower third, endoscopic biopsy:SQUAMOUS MUCOSA WITH NO PATHOLOGIC DIAGNOSIS.NO EVIDENCE OF REFLUX OR EOSINOPHILIC ESOPHAGITIS IS SEEN.  D. Esophagus, middle third, endoscopic biopsy: SQUAMOUS MUCOSA WITH NO PATHOLOGIC DIAGNOSIS. NO EVIDENCE OF REFLUX OR EOSINOPHILIC ESOPHAGITIS IS SEEN. E. Esophagus, upper third, endoscopic biopsy: SQUAMOUS MUCOSA WITH NO PATHOLOGIC DIAGNOSIS. NO EVIDENCE OF REFLUX OR EOSINOPHILIC ESOPHAGITIS IS SEEN. F. Terminal ileum, endoscopic biopsy: ILEAL MUCOSA WITH NO PATHOLOGIC DIAGNOSIS. NO ACUTE OR CHRONIC ILEITIS IS IDENTIFIED. G. Cecum, endoscopic biopsy: COLONIC MUCOSA WITH PATCHY MILD CHRONIC ACTIVE COLITIS. NEGATIVE FOR DYSPLASIA. H. Right/ascending, endoscopic biopsy: COLONIC MUCOSA WITH NO PATHOLOGIC DIAGNOSIS. NEGATIVE FOR CHRONIC AND ACTIVE COLITIS. NEGATIVE FOR DYSPLASIA. I. Left/descending, endoscopic biopsy: COLONIC MUCOSA WITH NO PATHOLOGIC DIAGNOSIS. NEGATIVE FOR CHRONIC AND ACTIVE COLITIS. NEGATIVE FOR  DYSPLASIA. J. Sigmoid colon, endoscopic biopsy: COLONIC MUCOSA WITH NO PATHOLOGIC DIAGNOSIS. NEGATIVE FOR CHRONIC AND ACTIVE COLITIS. NEGATIVE FOR DYSPLASIA. K. Rectum, endoscopic biopsy:  COLORECTAL MUCOSA WITH NO PATHOLOGIC DIAGNOSIS. NEGATIVE FOR CHRONIC AND ACTIVE COLITIS. NEGATIVE FOR DYSPLASIA.   04/29/18 EGD/colon at [facility]: A. Duodenum, endoscopic biopsy: Duodenal mucosa with no significant pathologic diagnosis. No villous blunting or intraepithelial lymphocytosis is seen. B. Stomach, endoscopic biopsy: Gastric antral mucosa with no significant pathologic diagnosis. Negative for evidence of Helicobacter pylori on routine H&E.  C. Esophagus, lower third, endoscopic biopsy: Squamous mucosa with marked intraepithelial eosinophil infiltrates (up to 75 per high power field). See comment. D. Esophagus, upper third, endoscopic biopsy: Squamous mucosa with marked intraepithelial eosinophil infiltrates (up to 75 per high power field). See comment. Comment: In both distal and proximal esophagus (C, D), the eosinophils involve both superficial and deeper portions of the squamous epithelium. Intercellular edema, eosinophilic microabscesses and eosinophilic degranulation are present. The morphological findings are highly suggestive of eosinophilic esophagitis. E. Terminal ileum, endoscopic biopsy: Ileal mucosa with no significant pathologic diagnosis. No active or chronic enteritis is seen.  F. Right ascending colon, endoscopic biopsy: Colonic mucosa with no significant pathologic diagnosis. No active or chronic colitis is seen. No granulomas are seen. Negative for dysplasia. G. Left descending colon, endoscopic biopsy: Colonic mucosa with no significant pathologic diagnosis. No active or chronic colitis is seen. No granulomas are seen. Negative for dysplasia. H. Sigmoid colon and rectum, endoscopic biopsy: Colonic mucosa with no significant pathologic diagnosis. No active or chronic colitis is seen. No granulomas are seen. Negative for dysplasia.  04/20/13 colonoscopy at [facility] by Dr. Gretta Colonoscopy completed 70-80 cm to cecum. Diffuse edema with adherent mucus and sporadic erosions but no  ulcerations,granularlity or significant friability. Right sided involvement greater than left. Stool collected for bacterial culture and Cdiff PCR. Biopsies submitted in formailn from cecum, ascending, descending and rectosigmoid colon. Awaiting results before initiating any therapy.  The terminal ileum was not evaluated at the time.     Past Medical History:  Diagnosis Date   Allergy    Asthma    Blood in stool    seen at [facility]- ulcerative colitis unlikely due to normal colonoscopy (2/20)   Eosinophilic esophagitis    Pneumonia     Past Surgical History:  Procedure Laterality Date   COLONOSCOPY N/A 04/20/2013   Procedure: COLONOSCOPY;  Surgeon: Fairy VEAR Gretta, MD;  Location: [facility] OR;  Service: Gastroenterology;  Laterality: N/A;   FRACTURE SURGERY      Current Outpatient Medications  Medication Sig Dispense Refill   budesonide  (ENTOCORT EC ) 3 MG 24 hr capsule Take 3 mg by mouth daily.     cholecalciferol (VITAMIN D ) 25 MCG (1000 UNIT) tablet Take 1,000 Units by mouth daily.     EPINEPHrine  0.3 mg/0.3 mL IJ SOAJ injection Inject 0.3 mg into the muscle as needed for anaphylaxis. 2 each 1   escitalopram  (LEXAPRO ) 10 MG tablet TAKE 1 TABLET BY MOUTH EVERY DAY 30 tablet 0   ferrous sulfate 325 (65 FE) MG tablet Take 325 mg by mouth every morning.     lansoprazole  (PREVACID ) 30 MG capsule Take 1 capsule (30 mg total) by mouth daily before breakfast. 30 capsule 5   budesonide  (ENTOCORT EC ) 3 MG 24 hr capsule Take 2 capsules (6 mg total) by mouth daily for 14 days, THEN 1 capsule (3 mg total) daily. 58 capsule 1   Budesonide  2 MG/10ML SUSP Take 10 mLs by mouth 2 (two)  times daily. (Patient not taking: Reported on 03/12/2024) 600 mL 2   No current facility-administered medications for this visit.    Allergies as of 02/21/2024 - Review Complete 02/21/2024  Allergen Reaction Noted   Peanut-containing drug products  04/14/2020   Peanut oil Nausea And Vomiting and Nausea Only 04/04/2013     Family History  Problem Relation Age of Onset   Diabetes Maternal Aunt    Cancer Maternal Grandmother    Colon polyps Neg Hx    Inflammatory bowel disease Neg Hx     Social History   Socioeconomic History   Marital status: Single    Spouse name: Not on file   Number of children: Not on file   Years of education: Not on file   Highest education level: Not on file  Occupational History   Not on file  Tobacco Use   Smoking status: Never   Smokeless tobacco: Never  Vaping Use   Vaping status: Never Used  Substance and Sexual Activity   Alcohol use: Never   Drug use: Never   Sexual activity: Not on file  Other Topics Concern   Not on file  Social History Narrative   3rd grade 2014-2015   Social Drivers of Health   Financial Resource Strain: Not on file  Food Insecurity: Not on file  Transportation Needs: Not on file  Physical Activity: Not on file  Stress: Not on file  Social Connections: Not on file  Intimate Partner Violence: Not on file     Review of Systems   Gen: Denies any fever, chills, fatigue, weight loss, lack of appetite.  CV: Denies chest pain, heart palpitations, peripheral edema, syncope.  Resp: Denies shortness of breath at rest or with exertion. Denies wheezing or cough.  GI: Denies dysphagia or odynophagia. Denies jaundice, hematemesis, fecal incontinence. GU : Denies urinary burning, urinary frequency, urinary hesitancy MS: Denies joint pain, muscle weakness, cramps, or limitation of movement.  Derm: Denies rash, itching, dry skin Psych: Denies depression, anxiety, memory loss, and confusion Heme: Denies bruising, bleeding, and enlarged lymph nodes.   Physical Exam   BP 106/62   Pulse 88   Temp 98.7 F (37.1 C)   Ht 5' 6 (1.676 m)   Wt 164 lb 9.6 oz (74.7 kg)   LMP 02/05/2024   BMI 26.57 kg/m  General:   Alert and oriented. Pleasant and cooperative. Well-nourished and well-developed.  Head:  Normocephalic and atraumatic. Eyes:   Without icterus Abdomen:  +BS, soft, non-tender and non-distended. No HSM noted. No guarding or rebound. No masses appreciated.  Rectal:  Deferred  Msk:  Symmetrical without gross deformities. Normal posture. Extremities:  Without edema. Neurologic:  Alert and  oriented x4;  grossly normal neurologically. Skin:  Intact without significant lesions or rashes. Psych:  Alert and cooperative. Normal mood and affect.   Assessment   Catherine Pittman is a 18 y.o. female presenting today with a history of eosinophilic esophagitis, eosinophilic colitis plus or minus IBD (+ pANCA and persistently elevated fecal calprotectin), mildly elevated transaminases in the past and liver biopsy normal, prior evaluation as above with last flex sig in 2023, here for 6 month follow-up.  Due to prior elevated fecal cal and symptoms, we provided a budesonide  course with subsequent taper; she is about to be finished with this and noted some improvement but not completely. We may need to continue a low dose to keep in remission (3 mg). I am requesting a CTE and fecal cal. Historically,  CRP and sed rate not elevated. Notably, she also has had Hep B surface antigen and TB gold assay, along with Varicella antibody checked in May 2025 in case of need to escalate therapy. Both patient and mother would like to hold off on colonoscopy. We may eventually need to do this in order to have updated histology prior to considering Entyvio, which has been used in this type of presentation with improvement. Safety profile would be better with entyvio vs azathioprine.   EOE: continue PPI. Resume budesonide  suspension low dose.             PLAN    CTE, fecal cal Resume oral budesonide  slurry for EOE May need to have low dose budesonide  capsules ongoing if continued flare Still recommend colonoscopy for tissue sampling prior to advancing therapy 3 month follow-up regardless   Therisa MICAEL Stager, PhD, ANP-BC [facility]    Addendum: budesonide  suspension not covered by pharmacy and requested off label pulmicort  ampules to be mixed in sugar water. She is having recurrent abdominal discomfort and more frequent stools. Will resume budesonide  at 6 mg for 2 weeks then have 3 mg ongoing for at least a month. One refill provided. Further recommendations after fecal cal results. CTE with mild nonspecific colitis of descending and sigmoid colon.

## 2024-02-21 NOTE — Patient Instructions (Signed)
 We are arranging a special CT for you in the near future.  Please have stool test done and do blood work fasting.   I will refill your budesonide  suspension for the eosinophilic esophagitis.  Further recommendations after imaging and labs!  We will see you in 3 months regardless!  I enjoyed seeing you again today! I value our relationship and want to provide genuine, compassionate, and quality care. You may receive a survey regarding your visit with me, and I welcome your feedback! Thanks so much for taking the time to complete this. I look forward to seeing you again.      Therisa MICAEL Stager, PhD, ANP-BC [facility]

## 2024-02-24 ENCOUNTER — Other Ambulatory Visit (HOSPITAL_COMMUNITY)
Admission: RE | Admit: 2024-02-24 | Discharge: 2024-02-24 | Disposition: A | Discharge status: Home / Self Care | Source: Ambulatory Visit | Attending: Gastroenterology | Admitting: Gastroenterology

## 2024-02-24 DIAGNOSIS — R7989 Other specified abnormal findings of blood chemistry: Secondary | ICD-10-CM | POA: Diagnosis not present

## 2024-02-25 LAB — COMPREHENSIVE METABOLIC PANEL WITH GFR
ALT: 17 IU/L (ref 0–32)
AST: 18 IU/L (ref 0–40)
Albumin: 4.4 g/dL (ref 4.0–5.0)
Alkaline Phosphatase: 83 IU/L (ref 42–106)
BUN/Creatinine Ratio: 8 — ABNORMAL LOW (ref 9–23)
BUN: 6 mg/dL (ref 6–20)
Bilirubin Total: 0.5 mg/dL (ref 0.0–1.2)
CO2: 22 mmol/L (ref 20–29)
Calcium: 9.5 mg/dL (ref 8.7–10.2)
Chloride: 103 mmol/L (ref 96–106)
Creatinine, Ser: 0.77 mg/dL (ref 0.57–1.00)
Globulin, Total: 2.6 g/dL (ref 1.5–4.5)
Glucose: 103 mg/dL — ABNORMAL HIGH (ref 70–99)
Potassium: 4.4 mmol/L (ref 3.5–5.2)
Sodium: 138 mmol/L (ref 134–144)
Total Protein: 7 g/dL (ref 6.0–8.5)
eGFR: 114 mL/min/1.73 (ref >59)

## 2024-02-27 ENCOUNTER — Ambulatory Visit (HOSPITAL_COMMUNITY)
Admission: RE | Admit: 2024-02-27 | Discharge: 2024-02-27 | Disposition: A | Discharge status: Home / Self Care | Source: Ambulatory Visit | Attending: Gastroenterology | Admitting: Gastroenterology

## 2024-02-27 ENCOUNTER — Other Ambulatory Visit (HOSPITAL_COMMUNITY)
Admission: RE | Admit: 2024-02-27 | Discharge: 2024-02-27 | Disposition: A | Discharge status: Home / Self Care | Source: Ambulatory Visit | Attending: Gastroenterology | Admitting: Gastroenterology

## 2024-02-27 ENCOUNTER — Other Ambulatory Visit: Payer: Self-pay | Admitting: *Deleted

## 2024-02-27 DIAGNOSIS — K529 Noninfective gastroenteritis and colitis, unspecified: Secondary | ICD-10-CM | POA: Diagnosis not present

## 2024-02-27 DIAGNOSIS — R109 Unspecified abdominal pain: Secondary | ICD-10-CM | POA: Diagnosis not present

## 2024-02-27 LAB — PREGNANCY, URINE: Preg Test, Ur: NEGATIVE

## 2024-02-27 MED ORDER — IOHEXOL 300 MG/ML  SOLN
125.0000 mL | Freq: Once | INTRAMUSCULAR | Status: AC | PRN
  Administered 2024-02-27: 125 mL via INTRAVENOUS

## 2024-02-29 ENCOUNTER — Other Ambulatory Visit: Payer: Self-pay | Admitting: Gastroenterology

## 2024-02-29 MED ORDER — BUDESONIDE 2 MG/10ML PO SUSP: 10.0000 mL | Freq: Two times a day (BID) | ORAL | 2 refills | Status: DC

## 2024-03-05 NOTE — Telephone Encounter (Signed)
 Catherine Pittman, it looks like maybe you were on this, but I can't figure out what she is requesting.

## 2024-03-06 ENCOUNTER — Telehealth: Payer: Self-pay

## 2024-03-06 NOTE — Telephone Encounter (Signed)
 Can we find out if this needs a PA? I am not sure why it is not being covered. I didn't send in Eohilia  to her pharmacy (sent in budesonide ). Eohilia  is a brand name for this, so I am unsure if the pharmacy requested to change?

## 2024-03-06 NOTE — Telephone Encounter (Signed)
 FYIBETHA Kung  I see where you sent in another Rx for the pt called Eohilia  2mg . But I see where she had been taking Budesonide  2mg /ml syrup. I just looked through notes and didn;t see a resaon for the change and what you changed her to was priced over $2000.00. I phoned and LMOVM for the pt to call me back so I can get some back ground to all this madness. Will keep you up to date once I find out

## 2024-03-06 NOTE — Telephone Encounter (Signed)
 Pt returned the call and advised me she couldn't afford the Budesonide  that's why she needed something else sent in. Please advise

## 2024-03-07 NOTE — Telephone Encounter (Signed)
 Noted  Will find out through her formulary but it was her pharmacy who suggested it since she could not afford it.

## 2024-03-08 ENCOUNTER — Ambulatory Visit: Payer: Self-pay | Admitting: Gastroenterology

## 2024-03-08 ENCOUNTER — Other Ambulatory Visit: Payer: Self-pay | Admitting: Gastroenterology

## 2024-03-08 MED ORDER — BUDESONIDE 2 MG/10ML PO SUSP: 10.0000 mL | Freq: Two times a day (BID) | ORAL | 2 refills | Status: DC

## 2024-03-08 NOTE — Addendum Note (Signed)
 Addended by: SHIRLEAN THERISA ORN on: 03/08/2024 04:58 PM   Modules accepted: Orders

## 2024-03-08 NOTE — Telephone Encounter (Signed)
 Dena:  I tried to call [facility] and left a message. They will hopefully be calling back and speak to you. I am just trying to figure out what I am supposed to send in that is covered by her insurance.

## 2024-03-09 NOTE — Telephone Encounter (Signed)
 Phoned to [facility] on [address] and was advised that pt's insurance will pay for 0.5 or 2.5. please send in one or other if not her insurance will not pay

## 2024-03-09 NOTE — Telephone Encounter (Signed)
 May need to do off label pulmicort  ampules and empty into medicine cup to make a slurry. I have sent message to patient.

## 2024-03-12 MED ORDER — BUDESONIDE 3 MG PO CPEP: ORAL_CAPSULE | ORAL | 1 refills | Status: AC

## 2024-03-14 ENCOUNTER — Other Ambulatory Visit: Payer: Self-pay | Admitting: Family Medicine

## 2024-03-14 DIAGNOSIS — F411 Generalized anxiety disorder: Secondary | ICD-10-CM

## 2024-04-03 ENCOUNTER — Other Ambulatory Visit: Payer: Self-pay | Admitting: Gastroenterology

## 2024-04-14 LAB — CALPROTECTIN, FECAL: Calprotectin, Fecal: 318 ug/g — ABNORMAL HIGH (ref 0–120)

## 2024-05-22 ENCOUNTER — Ambulatory Visit: Admitting: Gastroenterology

## 2024-06-06 ENCOUNTER — Other Ambulatory Visit: Payer: Self-pay | Admitting: Gastroenterology

## 2024-06-06 DIAGNOSIS — K200 Eosinophilic esophagitis: Secondary | ICD-10-CM

## 2024-06-19 ENCOUNTER — Telehealth: Payer: Self-pay

## 2024-06-19 NOTE — Telephone Encounter (Signed)
 Documentation from [facility] regarding the pt being on Lansoprazole  for greater than 12 weeks. Placed on your desk I the folder.

## 2024-06-20 NOTE — Telephone Encounter (Signed)
 Noted. Reviewed. No change needed.

## 2024-07-02 ENCOUNTER — Encounter (HOSPITAL_COMMUNITY): Payer: Self-pay

## 2024-07-02 ENCOUNTER — Other Ambulatory Visit: Payer: Self-pay

## 2024-07-02 ENCOUNTER — Emergency Department (HOSPITAL_COMMUNITY)
Admission: EM | Admit: 2024-07-02 | Discharge: 2024-07-02 | Disposition: A | Discharge status: Home / Self Care | Attending: Emergency Medicine | Admitting: Emergency Medicine

## 2024-07-02 DIAGNOSIS — D72829 Elevated white blood cell count, unspecified: Secondary | ICD-10-CM | POA: Diagnosis not present

## 2024-07-02 DIAGNOSIS — R1030 Lower abdominal pain, unspecified: Secondary | ICD-10-CM | POA: Insufficient documentation

## 2024-07-02 DIAGNOSIS — R109 Unspecified abdominal pain: Secondary | ICD-10-CM

## 2024-07-02 DIAGNOSIS — R000 Tachycardia, unspecified: Secondary | ICD-10-CM | POA: Insufficient documentation

## 2024-07-02 DIAGNOSIS — Z91010 Allergy to peanuts: Secondary | ICD-10-CM | POA: Diagnosis not present

## 2024-07-02 LAB — COMPREHENSIVE METABOLIC PANEL WITH GFR
ALT: 11 U/L (ref 0–44)
AST: 17 U/L (ref 15–41)
Albumin: 4.4 g/dL (ref 3.5–5.0)
Alkaline Phosphatase: 80 U/L (ref 38–126)
Anion gap: 12 (ref 5–15)
BUN: 7 mg/dL (ref 6–20)
CO2: 25 mmol/L (ref 22–32)
Calcium: 9 mg/dL (ref 8.9–10.3)
Chloride: 104 mmol/L (ref 98–111)
Creatinine, Ser: 0.8 mg/dL (ref 0.44–1.00)
GFR, Estimated: >60 mL/min
Glucose, Bld: 110 mg/dL — ABNORMAL HIGH (ref 70–99)
Potassium: 3.2 mmol/L — ABNORMAL LOW (ref 3.5–5.1)
Sodium: 141 mmol/L (ref 135–145)
Total Bilirubin: 0.4 mg/dL (ref 0.0–1.2)
Total Protein: 7.5 g/dL (ref 6.5–8.1)

## 2024-07-02 LAB — CBC WITH DIFFERENTIAL/PLATELET
Abs Immature Granulocytes: 0.08 K/uL — ABNORMAL HIGH (ref 0.00–0.07)
Basophils Absolute: 0.1 K/uL (ref 0.0–0.1)
Basophils Relative: 0 %
Eosinophils Absolute: 0.6 K/uL — ABNORMAL HIGH (ref 0.0–0.5)
Eosinophils Relative: 3 %
HCT: 37.7 % (ref 36.0–46.0)
Hemoglobin: 13 g/dL (ref 12.0–15.0)
Immature Granulocytes: 0 %
Lymphocytes Relative: 9 %
Lymphs Abs: 1.7 K/uL (ref 0.7–4.0)
MCH: 29.8 pg (ref 26.0–34.0)
MCHC: 34.5 g/dL (ref 30.0–36.0)
MCV: 86.5 fL (ref 80.0–100.0)
Monocytes Absolute: 0.8 K/uL (ref 0.1–1.0)
Monocytes Relative: 4 %
Neutro Abs: 14.9 K/uL — ABNORMAL HIGH (ref 1.7–7.7)
Neutrophils Relative %: 84 %
Platelets: 426 K/uL — ABNORMAL HIGH (ref 150–400)
RBC: 4.36 MIL/uL (ref 3.87–5.11)
RDW: 11.8 % (ref 11.5–15.5)
WBC: 18.2 K/uL — ABNORMAL HIGH (ref 4.0–10.5)
nRBC: 0 % (ref 0.0–0.2)

## 2024-07-02 LAB — PREGNANCY, URINE: Preg Test, Ur: NEGATIVE

## 2024-07-02 LAB — URINALYSIS, W/ REFLEX TO CULTURE (INFECTION SUSPECTED)
Bilirubin Urine: NEGATIVE
Glucose, UA: NEGATIVE mg/dL
Hgb urine dipstick: NEGATIVE
Ketones, ur: NEGATIVE mg/dL
Leukocytes,Ua: NEGATIVE
Nitrite: NEGATIVE
Protein, ur: NEGATIVE mg/dL
Specific Gravity, Urine: 1.003 — ABNORMAL LOW (ref 1.005–1.030)
pH: 6 (ref 5.0–8.0)

## 2024-07-02 MED ORDER — FENTANYL CITRATE (PF) 100 MCG/2ML IJ SOLN: 50.0000 ug | Freq: Once | INTRAMUSCULAR | Status: DC

## 2024-07-02 MED ORDER — SODIUM CHLORIDE 0.9 % IV BOLUS
1000.0000 mL | Freq: Once | INTRAVENOUS | Status: AC
  Administered 2024-07-02: 1000 mL via INTRAVENOUS

## 2024-07-02 NOTE — ED Provider Notes (Signed)
 " [place] EMERGENCY DEPARTMENT AT [facility] Provider Note   CSN: [id number] Arrival date & time: 07/02/24  9343     Patient presents with: Abdominal Pain   Catherine Pittman is a 19 y.o. female.    Abdominal Pain Patient presents with lower abdominal pain.  Woke her up with it this morning.  History of likely inflammatory bowel disease.  Has been on budesonide  however took a couple weeks off of the medicine and just darted back up around 3 days ago.  Had had loose stools.  Now tightened up some.  Pain try and have a bowel movement today.  No fevers.  Not vomiting.     Prior to Admission medications  Medication Sig Start Date End Date Taking? Authorizing Provider  budesonide  (ENTOCORT EC ) 3 MG 24 hr capsule Take 3 mg by mouth daily.    [provider]  Budesonide  2 MG/10ML SUSP Take 10 mLs by mouth 2 (two) times daily. Patient not taking: Reported on 03/12/2024 03/08/24   Shirlean Therisa ORN, NP  cholecalciferol (VITAMIN D ) 25 MCG (1000 UNIT) tablet Take 1,000 Units by mouth daily. 04/19/21   [provider]  EPINEPHrine  0.3 mg/0.3 mL IJ SOAJ injection Inject 0.3 mg into the muscle as needed for anaphylaxis. 02/06/21   Duanne Butler DASEN, MD  escitalopram  (LEXAPRO ) 10 MG tablet TAKE 1 TABLET BY MOUTH EVERY DAY 03/14/24   Duanne Butler DASEN, MD  ferrous sulfate 325 (65 FE) MG tablet Take 325 mg by mouth every morning. 03/24/21   [provider]  lansoprazole  (PREVACID ) 30 MG capsule TAKE 1 CAPSULE BY MOUTH DAILY BEFORE BREAKFAST. 06/11/24   Shirlean Therisa ORN, NP    Allergies: Peanut-containing drug products and Peanut oil    Review of Systems  Gastrointestinal:  Positive for abdominal pain.    Updated Vital Signs BP 116/75   Pulse (!) 124   Temp 98.5 F (36.9 C) (Oral)   Resp 17   Ht 5' 6 (1.676 m)   Wt 74.8 kg   LMP 06/14/2024   SpO2 100%   BMI 26.63 kg/m   Physical Exam Vitals and nursing note reviewed.  Cardiovascular:     Rate and Rhythm:  Tachycardia present.  Abdominal:     Comments: Lower abdominal tenderness rebound or guarding.  Neurological:     Mental Status: She is alert.     (all labs ordered are listed, but only abnormal results are displayed) Labs Reviewed  CBC WITH DIFFERENTIAL/PLATELET - Abnormal; Notable for the following components:      Result Value   WBC 18.2 (*)    Platelets 426 (*)    Neutro Abs 14.9 (*)    Eosinophils Absolute 0.6 (*)    Abs Immature Granulocytes 0.08 (*)    All other components within normal limits  COMPREHENSIVE METABOLIC PANEL WITH GFR - Abnormal; Notable for the following components:   Potassium 3.2 (*)    Glucose, Bld 110 (*)    All other components within normal limits  URINALYSIS, W/ REFLEX TO CULTURE (INFECTION SUSPECTED) - Abnormal; Notable for the following components:   Color, Urine STRAW (*)    Specific Gravity, Urine 1.003 (*)    Bacteria, UA RARE (*)    All other components within normal limits  CALPROTECTIN, FECAL  PREGNANCY, URINE  CBC WITH DIFFERENTIAL/PLATELET    EKG: None  Radiology: No results found.   Procedures   Medications Ordered in the ED  sodium chloride  0.9 % bolus 1,000 mL (  0 mLs Intravenous Stopped 07/02/24 9081)                                    Medical Decision Making Amount and/or Complexity of Data Reviewed Labs: ordered.  Risk Prescription drug management.   Patient with chronic colitis.  Had been off her medicines.  Started back up around 3 days ago.  Now pain today.  Differential diagnosis does include the colitis.  Also other causes such as perforations.  Will get basic blood work and give symptomatic treatment at this time.  Reviewed GI notes.  White count is somewhat elevated.  Could be from the treatment however.  Feeling better after treatment.  Heart rate has come down by palpation.  I think reasonable to discharge home continue her steroids and have follow-up with GI.     Final diagnoses:  Abdominal pain,  unspecified abdominal location    ED Discharge Orders     None          Patsey Lot, MD 07/02/24 1511  "

## 2024-07-02 NOTE — Discharge Instructions (Signed)
 There is likely a flare of your colitis.  Follow-up with gastroenterology.  Stop by or give a call.  Return for worsening symptoms.

## 2024-07-02 NOTE — ED Triage Notes (Signed)
 Pt reports lower abdominal pain waking her up this morning. Pt reports feel like pressure pain that radiates into her rectum. Pt states she is being follow by [facility] for stomach issues. Pt reports one episode of vomiting, yellow in color this morning.

## 2024-07-03 DIAGNOSIS — K529 Noninfective gastroenteritis and colitis, unspecified: Secondary | ICD-10-CM

## 2024-07-05 NOTE — Telephone Encounter (Signed)
 Dena:  I ordered labs and stool studies. I sent message back to patient. If she has any further concerns, please let me know. If it is tomorrow, please address with another provider if urgent.

## 2024-07-05 NOTE — Telephone Encounter (Signed)
 Noted   Will call pt tomorrow if not read by then

## 2024-07-07 ENCOUNTER — Other Ambulatory Visit: Payer: Self-pay | Admitting: Gastroenterology

## 2024-07-11 NOTE — Telephone Encounter (Signed)
 Phoned the pt and LMOVM to return call regarding her insurance information

## 2024-07-11 NOTE — Telephone Encounter (Signed)
 Pt already has an appt with Therisa Stager, NP for 07/19/2024 @ 9:30am

## 2024-07-13 NOTE — Telephone Encounter (Signed)
 Phoned and LMOVM for pt to call regarding Rx and insurance

## 2024-07-18 LAB — COMPREHENSIVE METABOLIC PANEL WITH GFR
ALT: 17 [IU]/L (ref 0–32)
AST: 18 [IU]/L (ref 0–40)
Albumin: 4.7 g/dL (ref 4.0–5.0)
Alkaline Phosphatase: 81 [IU]/L (ref 42–106)
BUN/Creatinine Ratio: 13 (ref 9–23)
BUN: 10 mg/dL (ref 6–20)
Bilirubin Total: 0.7 mg/dL (ref 0.0–1.2)
CO2: 22 mmol/L (ref 20–29)
Calcium: 10.5 mg/dL — ABNORMAL HIGH (ref 8.7–10.2)
Chloride: 100 mmol/L (ref 96–106)
Creatinine, Ser: 0.79 mg/dL (ref 0.57–1.00)
Globulin, Total: 3.2 g/dL (ref 1.5–4.5)
Glucose: 83 mg/dL (ref 70–99)
Potassium: 4.5 mmol/L (ref 3.5–5.2)
Sodium: 139 mmol/L (ref 134–144)
Total Protein: 7.9 g/dL (ref 6.0–8.5)
eGFR: 110 mL/min/{1.73_m2}

## 2024-07-18 LAB — CBC WITH DIFFERENTIAL/PLATELET
Basophils Absolute: 0 10*3/uL (ref 0.0–0.2)
Basos: 0 %
EOS (ABSOLUTE): 0.4 10*3/uL (ref 0.0–0.4)
Eos: 3 %
Hematocrit: 41.5 % (ref 34.0–46.6)
Hemoglobin: 13.6 g/dL (ref 11.1–15.9)
Immature Grans (Abs): 0 10*3/uL (ref 0.0–0.1)
Immature Granulocytes: 0 %
Lymphocytes Absolute: 2.1 10*3/uL (ref 0.7–3.1)
Lymphs: 19 %
MCH: 29.3 pg (ref 26.6–33.0)
MCHC: 32.8 g/dL (ref 31.5–35.7)
MCV: 89 fL (ref 79–97)
Monocytes Absolute: 0.6 10*3/uL (ref 0.1–0.9)
Monocytes: 6 %
Neutrophils Absolute: 8.3 10*3/uL — ABNORMAL HIGH (ref 1.4–7.0)
Neutrophils: 72 %
Platelets: 490 10*3/uL — ABNORMAL HIGH (ref 150–450)
RBC: 4.64 x10E6/uL (ref 3.77–5.28)
RDW: 11.9 % (ref 11.7–15.4)
WBC: 11.5 10*3/uL — ABNORMAL HIGH (ref 3.4–10.8)

## 2024-07-18 NOTE — Telephone Encounter (Signed)
 Pt is coming in tomorrow will ask regarding this

## 2024-07-19 ENCOUNTER — Ambulatory Visit: Payer: Self-pay | Admitting: Gastroenterology

## 2024-07-19 ENCOUNTER — Ambulatory Visit: Admitting: Gastroenterology

## 2024-07-19 ENCOUNTER — Encounter: Payer: Self-pay | Admitting: Gastroenterology

## 2024-07-19 VITALS — BP 110/68 | HR 92 | Temp 98.4°F | Ht 66.0 in | Wt 154.2 lb

## 2024-07-19 DIAGNOSIS — K529 Noninfective gastroenteritis and colitis, unspecified: Secondary | ICD-10-CM | POA: Diagnosis not present

## 2024-07-19 DIAGNOSIS — R1084 Generalized abdominal pain: Secondary | ICD-10-CM | POA: Insufficient documentation

## 2024-07-19 DIAGNOSIS — K200 Eosinophilic esophagitis: Secondary | ICD-10-CM | POA: Diagnosis not present

## 2024-07-19 DIAGNOSIS — R1310 Dysphagia, unspecified: Secondary | ICD-10-CM | POA: Insufficient documentation

## 2024-07-19 NOTE — Telephone Encounter (Signed)
 Pt had 2 more bottles at home

## 2024-07-19 NOTE — Patient Instructions (Addendum)
 I recommend Women's Health probiotic, Align, Restora, or Digestive advantage.   Let's increase prevacid  to twice a day, 30 minutes before meals. Use the medications you already have, and we will refill as needed.   Continue budesonide  2 mg another 2 weeks, then decrease to 1mg  for 2 weeks then stop.  Please have labs and stool tests done. We are making sure you do not have any infection going on.  We will be arranging a colonoscopy and upper endoscopy with Dr. Cindie in a few weeks!  Further recommendations to follow!  I enjoyed seeing you again today! I value our relationship and want to provide genuine, compassionate, and quality care. You may receive a survey regarding your visit with me, and I welcome your feedback! Thanks so much for taking the time to complete this. I look forward to seeing you again.      Therisa MICAEL Stager, PhD, ANP-BC [facility]

## 2024-07-19 NOTE — Progress Notes (Signed)
 "   Gastroenterology Office Note     Primary Care Physician:  Duanne Butler DASEN, MD  Primary Gastroenterologist: Dr Cindie    Chief Complaint   Chief Complaint  Patient presents with   Follow-up    Pt here for her 3 month follow up. Pt states she has been doing good on the Budesonide      History of Present Illness   Catherine Pittman is a 19 y.o. female presenting today with a history of eosinophilic esophagitis, eosinophilic colitis plus or minus IBD (+ pANCA and persistently elevated fecal calprotectin), mildly elevated transaminases in the past now normalized and liver biopsy normal. Significant workup previously as below. She was last seen in Sept 2025. She has declined updated colonoscopy and preferred to do imaging in the past. Her mother is present with her today and very supportive.   Sept 2025: CTE with mild nonspecific colitis of descending and sigmoid colon  Last fecal cal Oct 2025: 318, previously 907.   She has been provided budesonide  course in sept 2025, Jan 2026.  Jan 2026 went to ED, WBC count 18.2. Recheck this week improved to 11.5. LFTs normal.   She feels improved after visit to the ED and had done well off budesonide  until acute onset of abdominal pain. She is on budesonide  6 mg daily right now and has been doing this for 2 weeks since ED visit. No imaging done then. Stool studies requested but not completed yet.   Has intermittent abdominal pain, loose stool. Has 1-2 loose stool a day. Foul odor to it. No recent antibiotics.   Intermittent solid food dysphagia. Taking prevacid  just once daily.  She would like to pursue colonoscopy/EGD/dilation now.     PRIOR EVALUATION:  EGD 03/25/2022 largely unremarkable, mildly abnormal mucosa of the esophagus.  All biopsies WNL.   Flexible sigmoidoscopy 03/25/2022 with mild inflammation in the mid descending colon and distal rectum.  Biopsies in the rectum showed mild focal active colitis, all other colon biopsies  WNL.   Previous Workup:   GI symptoms started at 19yo with abdominal pain and hematchezia. Her labs showed an elevated calprotectin. 2014 EGD/colonoscopy diagnosed colitis/IBD at [facility] and then was referred to [facility]. She was placed on a few months of sulfasalazine . At [facility] a repeat EGD/colonoscopy was done and parents were told that she did not have IBD but did have EoE. She was placed on Flovent  2 puffs BID and further biopsies were negative for EoE. Of note the most recent colonoscopy from 09/2019 showed chronic active colitis in the cecum which is c/w IBD. Parent state that they were unaware of this. Her previous GI doctor had placed her on high dose steroids with a wean. She was close to weaning off this summer, but then they reportedly were told by PCP to continue it at a lower dose. She had been on 10mg  daily for 4 months prior to her previous appointment. She is not on a prophylactic PPI.   01/19/22 C diff negative, calprotectin 602 (Cdiff/GIP/O&P neg) 05/2021 Stool O&P negative ([facility]) 01/26/21 MRCP normal 01/05/21 ALT 45, GGT 89 (rest of autoimmune hepatitis work up normal), hep B not immune - A1At MM, ceruloplasmin 27.8, ASMA (-), anti-LKM (-), ANA (-), INR 1.0 10/07/20 ALT 60, GGT 98 and (+)pANCA, ESR 23 09/05/20 labs were done at [facility] per mother (not scanned into our system). 07/04/20 calprotectin 360, esr 34, ferritin 4, ggt 97, vit D 20 03/18/20 ttg iga <1 with IGA 109, urea  breath test  neg 03/17/20 WBC 16, plt 628, hg normal 11.4, HFP normal, ESR 25 03/03/20 capsule endoscopy - Normal small bowel. 01/25/20 MRI Impression: No active small bowel or colonic inflammation. No evidence of stricturing or fistulization.  01/09/20 c diff neg, plt 652, crp 9, ESR 58  Calprotectin: 01/19/22 602, 01/05/21 421 (on Lialda), 10/07/20 268, 07/04/20 360, 12/27/19 399, 08/13/19 352, 03/15/19 911 (after coming off sulfasalazine  - Dr. Bambi had recommended to repeat EGD and Colonoscopy, but family did not want  to proceed at that time), 11/12/15 135  Component  05/21/21 EGD/colonoscopy: on Lialda and PPI (?BID) showed upper and lower gastrointestinal eosinophilia    A. DUODENUM, BIOPSY: - Superficial fragments of duodenum with preserved villous architecture.   B. STOMACH, BIOPSY: - Gastric mucosa with no histologic abnormalities. - No eosinophils or H. pylori organisms identified on routine H&E sections.   C. ESOPHAGUS, DISTAL, BIOPSY: - Benign squamous mucosa with increased intraepithelial eosinophils (focally up to 80/HPF).   D. ESOPHAGUS, MID, BIOPSY: - Benign squamous mucosa with no evidence of intraepithelial eosinophils.   E. ESOPHAGUS, PROXIMAL, BIOPSY: - Benign squamous mucosa with rare intraepithelial eosinophils (focally up to 1/HPF).   F. SMALL INTESTINE, TERMINAL ILEUM, BIOPSY: - Small bowel mucosa with no diagnostic abnormalities. - No evidence of increased eosinophils, intraepithelial lymphocytes, villous atrophy, or crypt hyperplasia.    G. COLON, ASCENDING, BIOPSY: - Colonic mucosa with increased intramucosal eosinophils (focally >100 eosinophils per HPF). - No evidence of chronic colitis.    H. COLON, TRANSVERSE & ASCENDING, BIOPSY: - Colonic mucosa with increased intraepithelial eosinophils (focally up to 50/HPF). - No evidence of chronic colitis.    I. COLON, RECTOSIGMOID, BIOPSY: - Colonic mucosa with increased intraepithelial eosinophils (focally up to 30/HPF). - No evidence of chronic colitis.  - See comment.    Electronically signed by Darice Lazaro Eloisa Patricio, MD on 05/26/2021 at 1435  Comment    The patient's history of asthma, eosinophilic esophagitis, and possible inflammatory bowel disease, currently on lansoprazole  and Mesalazine is noted.    Histologic sections demonstrate increase in intraepithelial eosinophils (esophagus), and increased eosinophils within the lamina propria (colon). There are no histologic features supporting the diagnosis of  inflammatory bowel disease on these specimens; including active inflammation (such as cryptitis, crypt abscess, or ulcers), or features of chronic injury (including architectural distortion of the mucosa, basal plasmacytosis, or metaplasia).    Overall, the above findings, in conjunction with the clinical history of asthma, raises the possibility of an eosinophilic enteritis or medication effect. However, given that the increase of intraepithelial eosinophils in the esophagus is limited to the distal aspect, the possibility of an underlying/superimposed gastroesophageal reflux disease cannot be excluded. Correlation with clinical and endoscopic findings remains essential.   09/19/19 EGD/colon at [facility]: on flovent   A. Duodenum, second part and bulb, endoscopic biopsy: DUODENAL MUCOSA WITH NO PATHOLOGIC DIAGNOSIS.NO VILLOUS BLUNTING OR INCREASED INTRAEPITHELIAL LYMPHOCYTES ARE IDENTIFIED. B. Stomach, endoscopic biopsy: GASTRIC ANTRAL AND OXYNTIC MUCOSA WITH NO PATHOLOGIC DIAGNOSIS. NO ACTIVE OR CHRONIC GASTRITIS IS SEEN. NEGATIVE FOR EVIDENCE OF HELICOBACTER PYLORI ON ROUTINE H&E. C. Esophagus, lower third, endoscopic biopsy:SQUAMOUS MUCOSA WITH NO PATHOLOGIC DIAGNOSIS.NO EVIDENCE OF REFLUX OR EOSINOPHILIC ESOPHAGITIS IS SEEN.  D. Esophagus, middle third, endoscopic biopsy: SQUAMOUS MUCOSA WITH NO PATHOLOGIC DIAGNOSIS. NO EVIDENCE OF REFLUX OR EOSINOPHILIC ESOPHAGITIS IS SEEN. E. Esophagus, upper third, endoscopic biopsy: SQUAMOUS MUCOSA WITH NO PATHOLOGIC DIAGNOSIS. NO EVIDENCE OF REFLUX OR EOSINOPHILIC ESOPHAGITIS IS SEEN. F. Terminal ileum, endoscopic biopsy: ILEAL MUCOSA WITH NO PATHOLOGIC DIAGNOSIS.  NO ACUTE OR CHRONIC ILEITIS IS IDENTIFIED. G. Cecum, endoscopic biopsy: COLONIC MUCOSA WITH PATCHY MILD CHRONIC ACTIVE COLITIS. NEGATIVE FOR DYSPLASIA. H. Right/ascending, endoscopic biopsy: COLONIC MUCOSA WITH NO PATHOLOGIC DIAGNOSIS. NEGATIVE FOR CHRONIC AND ACTIVE COLITIS. NEGATIVE FOR DYSPLASIA. I.  Left/descending, endoscopic biopsy: COLONIC MUCOSA WITH NO PATHOLOGIC DIAGNOSIS. NEGATIVE FOR CHRONIC AND ACTIVE COLITIS. NEGATIVE FOR DYSPLASIA. J. Sigmoid colon, endoscopic biopsy: COLONIC MUCOSA WITH NO PATHOLOGIC DIAGNOSIS. NEGATIVE FOR CHRONIC AND ACTIVE COLITIS. NEGATIVE FOR DYSPLASIA. K. Rectum, endoscopic biopsy: COLORECTAL MUCOSA WITH NO PATHOLOGIC DIAGNOSIS. NEGATIVE FOR CHRONIC AND ACTIVE COLITIS. NEGATIVE FOR DYSPLASIA.   04/29/18 EGD/colon at [facility]: A. Duodenum, endoscopic biopsy: Duodenal mucosa with no significant pathologic diagnosis. No villous blunting or intraepithelial lymphocytosis is seen. B. Stomach, endoscopic biopsy: Gastric antral mucosa with no significant pathologic diagnosis. Negative for evidence of Helicobacter pylori on routine H&E.  C. Esophagus, lower third, endoscopic biopsy: Squamous mucosa with marked intraepithelial eosinophil infiltrates (up to 75 per high power field). See comment. D. Esophagus, upper third, endoscopic biopsy: Squamous mucosa with marked intraepithelial eosinophil infiltrates (up to 75 per high power field). See comment. Comment: In both distal and proximal esophagus (C, D), the eosinophils involve both superficial and deeper portions of the squamous epithelium. Intercellular edema, eosinophilic microabscesses and eosinophilic degranulation are present. The morphological findings are highly suggestive of eosinophilic esophagitis. E. Terminal ileum, endoscopic biopsy: Ileal mucosa with no significant pathologic diagnosis. No active or chronic enteritis is seen.  F. Right ascending colon, endoscopic biopsy: Colonic mucosa with no significant pathologic diagnosis. No active or chronic colitis is seen. No granulomas are seen. Negative for dysplasia. G. Left descending colon, endoscopic biopsy: Colonic mucosa with no significant pathologic diagnosis. No active or chronic colitis is seen. No granulomas are seen. Negative for dysplasia. H. Sigmoid colon  and rectum, endoscopic biopsy: Colonic mucosa with no significant pathologic diagnosis. No active or chronic colitis is seen. No granulomas are seen. Negative for dysplasia.  04/20/13 colonoscopy at [facility] by Dr. Gretta Colonoscopy completed 70-80 cm to cecum. Diffuse edema with adherent mucus and sporadic erosions but no ulcerations,granularlity or significant friability. Right sided involvement greater than left. Stool collected for bacterial culture and Cdiff PCR. Biopsies submitted in formailn from cecum, ascending, descending and rectosigmoid colon. Awaiting results before initiating any therapy.  The terminal ileum was not evaluated at the time.  Past Medical History:  Diagnosis Date   Allergy    Asthma    Blood in stool    seen at [facility]- ulcerative colitis unlikely due to normal colonoscopy (2/20)   Eosinophilic esophagitis    Pneumonia     Past Surgical History:  Procedure Laterality Date   COLONOSCOPY N/A 04/20/2013   Procedure: COLONOSCOPY;  Surgeon: Fairy VEAR Gretta, MD;  Location: [facility] OR;  Service: Gastroenterology;  Laterality: N/A;   FRACTURE SURGERY      Current Outpatient Medications  Medication Sig Dispense Refill   budesonide  (ENTOCORT EC ) 3 MG 24 hr capsule Take 3 mg by mouth daily.     cholecalciferol (VITAMIN D ) 25 MCG (1000 UNIT) tablet Take 1,000 Units by mouth daily.     EPINEPHrine  0.3 mg/0.3 mL IJ SOAJ injection Inject 0.3 mg into the muscle as needed for anaphylaxis. 2 each 1   escitalopram  (LEXAPRO ) 10 MG tablet TAKE 1 TABLET BY MOUTH EVERY DAY 90 tablet 0   ferrous sulfate 325 (65 FE) MG tablet Take 325 mg by mouth every morning.     Budesonide  2 MG/10ML SUSP Take 10 mLs by  mouth 2 (two) times daily. (Patient not taking: Reported on 07/19/2024) 600 mL 2   lansoprazole  (PREVACID ) 30 MG capsule TAKE 1 CAPSULE BY MOUTH DAILY BEFORE BREAKFAST. (Patient not taking: Reported on 07/19/2024) 90 capsule 1   No current facility-administered medications for this visit.     Allergies as of 07/19/2024 - Review Complete 07/19/2024  Allergen Reaction Noted   Peanut-containing drug products  04/14/2020   Peanut oil Nausea And Vomiting and Nausea Only 04/04/2013    Family History  Problem Relation Age of Onset   Diabetes Maternal Aunt    Cancer Maternal Grandmother    Colon polyps Neg Hx    Inflammatory bowel disease Neg Hx     Social History   Socioeconomic History   Marital status: Single    Spouse name: Not on file   Number of children: Not on file   Years of education: Not on file   Highest education level: Not on file  Occupational History   Not on file  Tobacco Use   Smoking status: Never   Smokeless tobacco: Never  Vaping Use   Vaping status: Never Used  Substance and Sexual Activity   Alcohol use: Never   Drug use: Never   Sexual activity: Not on file  Other Topics Concern   Not on file  Social History Narrative   3rd grade 2014-2015   Social Drivers of Health   Tobacco Use: Low Risk (07/19/2024)   Patient History    Smoking Tobacco Use: Never    Smokeless Tobacco Use: Never    Passive Exposure: Not on file  Financial Resource Strain: Not on file  Food Insecurity: Not on file  Transportation Needs: Not on file  Physical Activity: Not on file  Stress: Not on file  Social Connections: Not on file  Intimate Partner Violence: Not on file  Depression (EYV7-0): Not on file  Alcohol Screen: Not on file  Housing: Not on file  Utilities: Not on file  Health Literacy: Not on file     Review of Systems   Gen: Denies any fever, chills, fatigue, weight loss, lack of appetite.  CV: Denies chest pain, heart palpitations, peripheral edema, syncope.  Resp: Denies shortness of breath at rest or with exertion. Denies wheezing or cough.  GI:see HPI GU : Denies urinary burning, urinary frequency, urinary hesitancy MS: Denies joint pain, muscle weakness, cramps, or limitation of movement.  Derm: Denies rash, itching, dry skin Psych:  Denies depression, anxiety, memory loss, and confusion Heme: Denies bruising, bleeding, and enlarged lymph nodes.   Physical Exam   BP 110/68   Pulse 92   Temp 98.4 F (36.9 C)   Ht 5' 6 (1.676 m)   Wt 154 lb 3.2 oz (69.9 kg)   LMP 07/07/2024   BMI 24.89 kg/m  General:   Alert and oriented. Pleasant and cooperative. Well-nourished and well-developed.  Head:  Normocephalic and atraumatic. Eyes:  Without icterus Abdomen:  +BS, soft, mild TTP epigastric  and non-distended. No HSM noted. No guarding or rebound. No masses appreciated.  Rectal:  Deferred  Msk:  Symmetrical without gross deformities. Normal posture. Extremities:  Without edema. Neurologic:  Alert and  oriented x4;  grossly normal neurologically. Skin:  Intact without significant lesions or rashes. Psych:  Alert and cooperative. Normal mood and affect.     Assessment   Catherine Pittman is a 19 y.o. female presenting today with a history of 19 y.o. female presenting today with a history of eosinophilic esophagitis,  eosinophilic colitis plus or minus IBD (+ pANCA and persistently elevated fecal calprotectin), mildly elevated transaminases in the past now normalized and liver biopsy normal. Significant workup previously as above and presenting now for follow-up.  Mild nonspecific colitis of descending and sigmoid colon in Sept 2025 on CTE, responding well to budesonide  and then recurrent symptoms in January with abdominal pain and diarrhea, responding again to budesonide  and will continue on a taper. Need to rule out superimposed infectious process with stool studies. At this point, she is willing to pursue diagnostic ileocolonoscopy, which I think would be helpful with decision making going forward. In the past, we had discussed possible low dose budesonide  3 mg to keep in remission, but she would be best served by escalated therapy to include possibly Entyvio, which has been used in these types of presentations in the  literature and overall good safety profile vs azathioprine. I also note that etrasimod (if definitely dealing with UC), is being studied with EOE as well, so this could be considered.   EOE: noting solid food dysphagia intermittently. She is agreeable to EGD. Will increase PPI to BID; hopefully, she can have improvement with this. Does not seem to need aggressive therapy such as with dupixent. Historically on budesonide  suspension.   PLAN    Stool studies, fecal cal, celiac panel, anemia panel, Vit D Proceed with ileocolonoscopy and EGD with small bowel biopsies (celiac) by Dr. Cindie  in near future: the risks, benefits, and alternatives have been discussed with the patient in detail. The patient states understanding and desires to proceed.  Budesonide  taper described: keep an additional 6 mg for 2 weeks then 3 mg for 2 weeks then stop Prevacid  BID Consider Entyvio, depending on pathology Could consider etrasimod, as this is being studied in EOE and would treat UC Follow-up in 3 months regardless   Therisa MICAEL Stager, PhD, ANP-BC [facility]    "

## 2024-07-20 LAB — CELIAC DISEASE PANEL: Immunoglobulin A, (IgA) QN, Serum: 97 mg/dL (ref 87–352)

## 2024-07-20 LAB — ANEMIA PROFILE B
Basophils Absolute: 0.1 10*3/uL (ref 0.0–0.2)
Basos: 1 %
EOS (ABSOLUTE): 0.4 10*3/uL (ref 0.0–0.4)
Eos: 4 %
Ferritin: 72 ng/mL (ref 15–150)
Hematocrit: 41.9 % (ref 34.0–46.6)
Hemoglobin: 13.9 g/dL (ref 11.1–15.9)
Immature Grans (Abs): 0 10*3/uL (ref 0.0–0.1)
Immature Granulocytes: 0 %
Iron Saturation: 30 % (ref 15–55)
Iron: 80 ug/dL (ref 27–159)
Lymphocytes Absolute: 1.7 10*3/uL (ref 0.7–3.1)
Lymphs: 17 %
MCH: 29.8 pg (ref 26.6–33.0)
MCHC: 33.2 g/dL (ref 31.5–35.7)
MCV: 90 fL (ref 79–97)
Monocytes Absolute: 0.6 10*3/uL (ref 0.1–0.9)
Monocytes: 6 %
Neutrophils Absolute: 7.1 10*3/uL — ABNORMAL HIGH (ref 1.4–7.0)
Neutrophils: 72 %
Platelets: 494 10*3/uL — ABNORMAL HIGH (ref 150–450)
RBC: 4.66 x10E6/uL (ref 3.77–5.28)
RDW: 12.2 % (ref 11.7–15.4)
Retic Ct Pct: 1.4 % (ref 0.6–2.6)
Total Iron Binding Capacity: 271 ug/dL (ref 250–450)
UIBC: 191 ug/dL (ref 131–425)
Vitamin B-12: 800 pg/mL (ref 232–1245)
WBC: 10 10*3/uL (ref 3.4–10.8)

## 2024-07-20 LAB — HOMOCYSTEINE

## 2024-07-20 LAB — METHYLMALONIC ACID, SERUM

## 2024-07-20 LAB — VITAMIN D 25 HYDROXY (VIT D DEFICIENCY, FRACTURES): Vit D, 25-Hydroxy: 41.9 ng/mL (ref 30.0–100.0)
